# Patient Record
Sex: Male | Born: 1970 | Race: White | Hispanic: No | Marital: Single | Smoking: Former smoker
Health system: Southern US, Community
[De-identification: ages and names within clinical notes are randomized; demographics above are authoritative.]

## PROBLEM LIST (undated history)

## (undated) DIAGNOSIS — Z87442 Personal history of urinary calculi: Secondary | ICD-10-CM

## (undated) DIAGNOSIS — E785 Hyperlipidemia, unspecified: Secondary | ICD-10-CM

## (undated) DIAGNOSIS — M545 Low back pain, unspecified: Secondary | ICD-10-CM

## (undated) DIAGNOSIS — J45909 Unspecified asthma, uncomplicated: Secondary | ICD-10-CM

## (undated) DIAGNOSIS — R030 Elevated blood-pressure reading, without diagnosis of hypertension: Secondary | ICD-10-CM

## (undated) HISTORY — DX: Hyperlipidemia, unspecified: E78.5

## (undated) HISTORY — PX: TONSILLECTOMY: SUR1361

## (undated) HISTORY — DX: Unspecified asthma, uncomplicated: J45.909

## (undated) HISTORY — PX: KNEE SURGERY: SHX244

## (undated) HISTORY — DX: Elevated blood-pressure reading, without diagnosis of hypertension: R03.0

## (undated) HISTORY — PX: APPENDECTOMY: SHX54

## (undated) HISTORY — DX: Personal history of urinary calculi: Z87.442

---

## 2009-04-18 ENCOUNTER — Ambulatory Visit: Payer: Self-pay | Admitting: Family Medicine

## 2009-04-18 DIAGNOSIS — J45909 Unspecified asthma, uncomplicated: Secondary | ICD-10-CM

## 2009-04-18 DIAGNOSIS — Z87442 Personal history of urinary calculi: Secondary | ICD-10-CM | POA: Insufficient documentation

## 2009-04-18 DIAGNOSIS — J4520 Mild intermittent asthma, uncomplicated: Secondary | ICD-10-CM | POA: Insufficient documentation

## 2009-04-18 HISTORY — DX: Personal history of urinary calculi: Z87.442

## 2009-04-18 HISTORY — DX: Unspecified asthma, uncomplicated: J45.909

## 2009-04-20 ENCOUNTER — Ambulatory Visit: Payer: Self-pay | Admitting: Family Medicine

## 2009-04-20 LAB — CONVERTED CEMR LAB
Albumin: 4.1 g/dL (ref 3.5–5.2)
Basophils Absolute: 0 10*3/uL (ref 0.0–0.1)
Basophils Relative: 0.3 % (ref 0.0–3.0)
Bilirubin Urine: NEGATIVE
Blood in Urine, dipstick: NEGATIVE
CO2: 31 meq/L (ref 19–32)
Calcium: 9.5 mg/dL (ref 8.4–10.5)
Chloride: 105 meq/L (ref 96–112)
Creatinine, Ser: 0.8 mg/dL (ref 0.4–1.5)
Eosinophils Absolute: 0.2 10*3/uL (ref 0.0–0.7)
Glucose, Bld: 95 mg/dL (ref 70–99)
Glucose, Urine, Semiquant: NEGATIVE
HDL: 37 mg/dL — ABNORMAL LOW (ref >39.00)
Hemoglobin: 14.2 g/dL (ref 13.0–17.0)
Ketones, urine, test strip: NEGATIVE
MCHC: 33.5 g/dL (ref 30.0–36.0)
MCV: 98.4 fL (ref 78.0–100.0)
Monocytes Absolute: 0.6 10*3/uL (ref 0.1–1.0)
Neutro Abs: 4.3 10*3/uL (ref 1.4–7.7)
Neutrophils Relative %: 56.8 % (ref 43.0–77.0)
Nitrite: NEGATIVE
RBC: 4.29 M/uL (ref 4.22–5.81)
RDW: 12.3 % (ref 11.5–14.6)
Specific Gravity, Urine: 1.02
TSH: 1.11 microintl units/mL (ref 0.35–5.50)
Total CHOL/HDL Ratio: 5
Total Protein: 7.1 g/dL (ref 6.0–8.3)
Triglycerides: 98 mg/dL (ref 0.0–149.0)
pH: 7

## 2009-04-30 ENCOUNTER — Ambulatory Visit: Payer: Self-pay | Admitting: Family Medicine

## 2010-05-30 ENCOUNTER — Ambulatory Visit: Payer: Self-pay | Admitting: Family Medicine

## 2010-05-30 LAB — CONVERTED CEMR LAB
Albumin: 3.6 g/dL (ref 3.5–5.2)
BUN: 13 mg/dL (ref 6–23)
Basophils Relative: 0.6 % (ref 0.0–3.0)
Bilirubin Urine: NEGATIVE
Bilirubin, Direct: 0 mg/dL (ref 0.0–0.3)
CO2: 26 meq/L (ref 19–32)
Chloride: 104 meq/L (ref 96–112)
Cholesterol: 211 mg/dL — ABNORMAL HIGH (ref 0–200)
Creatinine, Ser: 0.8 mg/dL (ref 0.4–1.5)
Direct LDL: 168.5 mg/dL
Eosinophils Absolute: 0.1 10*3/uL (ref 0.0–0.7)
Glucose, Bld: 90 mg/dL (ref 70–99)
Glucose, Urine, Semiquant: NEGATIVE
Ketones, urine, test strip: NEGATIVE
MCHC: 33.9 g/dL (ref 30.0–36.0)
MCV: 96.6 fL (ref 78.0–100.0)
Monocytes Absolute: 0.4 10*3/uL (ref 0.1–1.0)
Neutrophils Relative %: 43.8 % (ref 43.0–77.0)
RBC: 4.1 M/uL — ABNORMAL LOW (ref 4.22–5.81)
RDW: 13.4 % (ref 11.5–14.6)
Specific Gravity, Urine: 1.02
Total CHOL/HDL Ratio: 6
Total Protein: 6.6 g/dL (ref 6.0–8.3)
pH: 6

## 2010-06-06 ENCOUNTER — Ambulatory Visit
Admission: RE | Admit: 2010-06-06 | Discharge: 2010-06-06 | Payer: Self-pay | Source: Home / Self Care | Attending: Family Medicine | Admitting: Family Medicine

## 2010-06-06 DIAGNOSIS — E785 Hyperlipidemia, unspecified: Secondary | ICD-10-CM

## 2010-06-06 HISTORY — DX: Hyperlipidemia, unspecified: E78.5

## 2010-07-04 NOTE — Assessment & Plan Note (Signed)
Summary: CPX // RS   Vital Signs:  Patient profile:   40 year old male Height:      71 inches Weight:      240 pounds BMI:     33.59 Temp:     97.8 degrees F oral Pulse rate:   72 / minute Pulse rhythm:   regular Resp:     12 per minute BP sitting:   130 / 84  (left arm) Cuff size:   large  Vitals Entered By: Sid Falcon LPN (June 06, 2010 8:54 AM)  Nutrition Counseling: Patient's BMI is greater than 25 and therefore counseled on weight management options.  History of Present Illness: Here for CPE.  Still smoking. Would like to consider Chantix.  has used patch in past. No hx of depression.   Hx asthma which has been mild and intermittent in past.  Exercising regularly.  Clinical Review Panels:  Immunizations   Last Tetanus Booster:  Historical (06/02/2005)   Last Flu Vaccine:  Fluvax 3+ (06/06/2010)   Allergies (verified): No Known Drug Allergies  Past History:  Past Medical History: Last updated: 04/18/2009 Asthma Chicken pox Kidney stones  Past Surgical History: Last updated: 04/18/2009 Tonsillectomy Left knee surgery (arthroscopic)  Family History: Last updated: 04/18/2009 Family History of Arthritis Family History Hypertension Family History Lung cancer  Grandparent Family History of Stroke   Grandparent Heart disease  Father 77 MI  Social History: Last updated: 04/18/2009 Occupation:  realtor Single Current Smoker Alcohol use-yes Regular exercise-yes  Risk Factors: Exercise: yes (04/18/2009)  Risk Factors: Smoking Status: current (04/18/2009) PMH-FH-SH reviewed for relevance  Review of Systems  The patient denies anorexia, fever, weight loss, weight gain, vision loss, decreased hearing, hoarseness, chest pain, syncope, dyspnea on exertion, peripheral edema, prolonged cough, headaches, hemoptysis, abdominal pain, melena, hematochezia, severe indigestion/heartburn, hematuria, incontinence, genital sores, muscle weakness, suspicious  skin lesions, transient blindness, difficulty walking, depression, unusual weight change, abnormal bleeding, enlarged lymph nodes, and testicular masses.    Physical Exam  General:  Well-developed,well-nourished,in no acute distress; alert,appropriate and cooperative throughout examination Head:  Normocephalic and atraumatic without obvious abnormalities. No apparent alopecia or balding. Eyes:  No corneal or conjunctival inflammation noted. EOMI. Perrla. Funduscopic exam benign, without hemorrhages, exudates or papilledema. Vision grossly normal. Ears:  External ear exam shows no significant lesions or deformities.  Otoscopic examination reveals clear canals, tympanic membranes are intact bilaterally without bulging, retraction, inflammation or discharge. Hearing is grossly normal bilaterally. Mouth:  Oral mucosa and oropharynx without lesions or exudates.  Teeth in good repair. Neck:  No deformities, masses, or tenderness noted. Lungs:  Normal respiratory effort, chest expands symmetrically. Lungs are clear to auscultation, no crackles or wheezes. Heart:  Normal rate and regular rhythm. S1 and S2 normal without gallop, murmur, click, rub or other extra sounds. Abdomen:  Bowel sounds positive,abdomen soft and non-tender without masses, organomegaly or hernias noted. Genitalia:  Testes bilaterally descended without nodularity, tenderness or masses. No scrotal masses or lesions. No penis lesions or urethral discharge. Msk:  No deformity or scoliosis noted of thoracic or lumbar spine.   Extremities:  No clubbing, cyanosis, edema, or deformity noted with normal full range of motion of all joints.   Neurologic:  alert & oriented X3 and cranial nerves II-XII intact.   Skin:  no rashes and no suspicious lesions.   Cervical Nodes:  No lymphadenopathy noted Psych:  Cognition and judgment appear intact. Alert and cooperative with normal attention span and concentration. No apparent delusions,  illusions,  hallucinations   Impression & Recommendations:  Problem # 1:  ROUTINE GENERAL MEDICAL EXAM@HEALTH  CARE FACL (ICD-V70.0) needs to lose some more weight and quit smoking. Good exercise level.  Flu vaccine advised and pt consents.  Problem # 2:  ASTHMA (ICD-493.90)  His updated medication list for this problem includes:    Proventil Hfa 108 (90 Base) Mcg/act Aers (Albuterol sulfate) .[name] Kitchen... 2 puffs every 4 hours as needed  Problem # 3:  HYPERLIPIDEMIA (ICD-272.4)  Complete Medication List: 1)  Multi Vitamin Mens Tabs (Multiple vitamin) .... Once daily 2)  Proventil Hfa 108 (90 Base) Mcg/act Aers (Albuterol sulfate) .... 2 puffs every 4 hours as needed 3)  Chantix Starting Month Pak 0.5 Mg X 11 & 1 Mg X 42 Tabs (Varenicline tartrate) .... Use as directed 4)  Chantix Continuing Month Pak 1 Mg Tabs (Varenicline tartrate) .... Use as directed  Other Orders: Flu Vaccine 73yrs + (16109) Admin 1st Vaccine (60454)  Patient Instructions: 1)  It is important that you exercise reguarly at least 20 minutes 5 times a week. If you develop chest pain, have severe difficulty breathing, or feel very tired, stop exercising immediately and seek medical attention.  2)  You need to lose weight. Consider a lower calorie diet and regular exercise.  3)  Schedule fasting lipids  in 4 months Prescriptions: CHANTIX CONTINUING MONTH PAK 1 MG TABS (VARENICLINE TARTRATE) use as directed  #1 x 1   Entered and Authorized by:   Evelena Peat MD   Signed by:   Evelena Peat MD on 06/06/2010   Method used:   Print then Give to Patient   RxID:   [id number] CHANTIX STARTING MONTH PAK 0.5 MG X 11 & 1 MG X 42 TABS (VARENICLINE TARTRATE) use as directed  #1 x 0   Entered and Authorized by:   Evelena Peat MD   Signed by:   Evelena Peat MD on 06/06/2010   Method used:   Print then Give to Patient   RxID:   [id number] PROVENTIL HFA 108 (90 BASE) MCG/ACT AERS (ALBUTEROL SULFATE) 2 puffs every 4 hours as  needed  #1 x 2   Entered and Authorized by:   Evelena Peat MD   Signed by:   Evelena Peat MD on 06/06/2010   Method used:   Print then Give to Patient   RxID:   [id number]    Orders Added: 1)  Flu Vaccine 50yrs + [44034] 2)  Admin 1st Vaccine [90471] 3)  Est. Patient 18-39 years [74259]   Immunizations Administered:  Influenza Vaccine # 1:    Vaccine Type: Fluvax 3+    Site: left deltoid    Mfr: Sanofi Pasteur    Dose: 0.5 ml    Route: IM    Given by: Sid Falcon LPN    Exp. Date: 11/01/2010    Lot #: [id number]  Flu Vaccine Consent Questions:    Do you have a history of severe allergic reactions to this vaccine? no    Any prior history of allergic reactions to egg and/or gelatin? no    Do you have a sensitivity to the preservative Thimersol? no    Do you have a past history of Guillan-Barre Syndrome? no    Do you currently have an acute febrile illness? no    Have you ever had a severe reaction to latex? no    Vaccine information given and explained to patient? yes   Immunizations Administered:  Influenza Vaccine # 1:  Vaccine Type: Fluvax 3+    Site: left deltoid    Mfr: Sanofi Pasteur    Dose: 0.5 ml    Route: IM    Given by: Sid Falcon LPN    Exp. Date: 11/01/2010    Lot #: [id number]

## 2010-07-23 ENCOUNTER — Other Ambulatory Visit (HOSPITAL_COMMUNITY): Payer: Self-pay | Admitting: Orthopedic Surgery

## 2010-07-23 DIAGNOSIS — M79671 Pain in right foot: Secondary | ICD-10-CM

## 2010-08-05 ENCOUNTER — Ambulatory Visit (HOSPITAL_COMMUNITY)
Admission: RE | Admit: 2010-08-05 | Discharge: 2010-08-05 | Disposition: A | Discharge status: Home / Self Care | Payer: BC Managed Care – PPO | Source: Ambulatory Visit | Attending: Orthopedic Surgery | Admitting: Orthopedic Surgery

## 2010-08-05 ENCOUNTER — Other Ambulatory Visit (HOSPITAL_COMMUNITY): Payer: Self-pay

## 2010-08-05 DIAGNOSIS — M79609 Pain in unspecified limb: Secondary | ICD-10-CM | POA: Insufficient documentation

## 2010-08-05 DIAGNOSIS — M79671 Pain in right foot: Secondary | ICD-10-CM

## 2010-08-05 MED ORDER — TECHNETIUM TC 99M MEDRONATE IV KIT
25.0000 | PACK | Freq: Once | INTRAVENOUS | Status: AC | PRN
  Administered 2010-08-05: 25 via INTRAVENOUS

## 2010-10-04 ENCOUNTER — Other Ambulatory Visit (INDEPENDENT_AMBULATORY_CARE_PROVIDER_SITE_OTHER): Payer: BC Managed Care – PPO | Admitting: Family Medicine

## 2010-10-04 DIAGNOSIS — E785 Hyperlipidemia, unspecified: Secondary | ICD-10-CM

## 2010-10-04 LAB — LIPID PANEL
Cholesterol: 195 mg/dL (ref 0–200)
VLDL: 34 mg/dL (ref 0.0–40.0)

## 2010-10-07 NOTE — Progress Notes (Signed)
Quick Note:  Pt informed on personally identified VM ______ 

## 2011-02-19 ENCOUNTER — Other Ambulatory Visit: Payer: Self-pay | Admitting: Family Medicine

## 2011-02-21 ENCOUNTER — Other Ambulatory Visit: Payer: Self-pay | Admitting: Family Medicine

## 2011-06-27 ENCOUNTER — Emergency Department (HOSPITAL_BASED_OUTPATIENT_CLINIC_OR_DEPARTMENT_OTHER)
Admission: EM | Admit: 2011-06-27 | Discharge: 2011-06-27 | Disposition: A | Discharge status: Home / Self Care | Payer: BC Managed Care – PPO | Attending: Emergency Medicine | Admitting: Emergency Medicine

## 2011-06-27 ENCOUNTER — Encounter (HOSPITAL_BASED_OUTPATIENT_CLINIC_OR_DEPARTMENT_OTHER): Payer: Self-pay

## 2011-06-27 DIAGNOSIS — S339XXA Sprain of unspecified parts of lumbar spine and pelvis, initial encounter: Secondary | ICD-10-CM | POA: Insufficient documentation

## 2011-06-27 DIAGNOSIS — F17200 Nicotine dependence, unspecified, uncomplicated: Secondary | ICD-10-CM | POA: Insufficient documentation

## 2011-06-27 DIAGNOSIS — X500XXA Overexertion from strenuous movement or load, initial encounter: Secondary | ICD-10-CM | POA: Insufficient documentation

## 2011-06-27 DIAGNOSIS — S39012A Strain of muscle, fascia and tendon of lower back, initial encounter: Secondary | ICD-10-CM

## 2011-06-27 MED ORDER — KETOROLAC TROMETHAMINE 60 MG/2ML IM SOLN
60.0000 mg | Freq: Once | INTRAMUSCULAR | Status: AC
  Administered 2011-06-27: 60 mg via INTRAMUSCULAR
  Filled 2011-06-27: qty 2

## 2011-06-27 MED ORDER — DICLOFENAC SODIUM 75 MG PO TBEC: 75.0000 mg | DELAYED_RELEASE_TABLET | Freq: Two times a day (BID) | ORAL | Status: DC

## 2011-06-27 MED ORDER — METHOCARBAMOL 500 MG PO TABS
500.0000 mg | ORAL_TABLET | Freq: Once | ORAL | Status: AC
  Administered 2011-06-27: 500 mg via ORAL
  Filled 2011-06-27: qty 1

## 2011-06-27 MED ORDER — DEXAMETHASONE SODIUM PHOSPHATE 10 MG/ML IJ SOLN
10.0000 mg | Freq: Once | INTRAMUSCULAR | Status: AC
  Administered 2011-06-27: 10 mg via INTRAMUSCULAR

## 2011-06-27 MED ORDER — DEXAMETHASONE SODIUM PHOSPHATE 4 MG/ML IJ SOLN
INTRAMUSCULAR | Status: AC
  Filled 2011-06-27: qty 3

## 2011-06-27 MED ORDER — DOXYCYCLINE HYCLATE 100 MG PO CAPS: 100.0000 mg | ORAL_CAPSULE | Freq: Two times a day (BID) | ORAL | Status: DC

## 2011-06-27 MED ORDER — OXYCODONE-ACETAMINOPHEN 5-325 MG PO TABS: 1.0000 | ORAL_TABLET | ORAL | Status: AC | PRN

## 2011-06-27 MED ORDER — METHOCARBAMOL 500 MG PO TABS: 500.0000 mg | ORAL_TABLET | Freq: Two times a day (BID) | ORAL | Status: AC

## 2011-06-27 NOTE — ED Provider Notes (Signed)
Medical screening examination/treatment/procedure(s) were performed by non-physician practitioner and as supervising physician I was immediately available for consultation/collaboration.   Dayton Bailiff, MD 06/27/11 1759

## 2011-06-27 NOTE — ED Notes (Signed)
C/o back spasms started 2 days ago after working out-worse today-states pain is lower back radiating to both legs and testicles

## 2011-06-27 NOTE — ED Provider Notes (Signed)
History     CSN: [id number]  Arrival date & time 06/27/11  1704   First MD Initiated Contact with Patient 06/27/11 1718      5:45 PM HPI Patient reports was bending over to pick up a lightweight object. States he acutely developed a pain in his lumbar spine. Reports pain radiates around bilateral lower back, buttocks, thighs, testicles, down to knees. Reports pain is worse with certain positions. Denies improvement in pain with Vicodin, rest, and icy hot patches denies numbness, tingling, weakness, saddle anesthesias, perineal numbness, incontinence. Patient is a 41 y.o. male presenting with back pain. The history is provided by the patient.  Back Pain  This is a new problem. The current episode started 2 days ago. The problem occurs constantly. The problem has been gradually worsening. Associated with: Bending over. The pain is present in the lumbar spine. The quality of the pain is described as shooting and aching. The pain radiates to the left thigh and right thigh. The pain is severe. The symptoms are aggravated by bending and certain positions. Pertinent negatives include no chest pain, no fever, no numbness, no headaches, no abdominal pain, no bowel incontinence, no perianal numbness, no bladder incontinence, no dysuria, no pelvic pain, no leg pain, no paresthesias, no paresis, no tingling and no weakness. He has tried NSAIDs, analgesics and bed rest for the symptoms. The treatment provided no relief.    History reviewed. No pertinent past medical history.  Past Surgical History  Procedure Date  . Knee surgery   . Tonsillectomy     No family history on file.  History  Substance Use Topics  . Smoking status: Current Everyday Smoker  . Smokeless tobacco: Not on file  . Alcohol Use: Yes      Review of Systems  Constitutional: Negative for fever and chills.  HENT: Negative for neck pain.   Respiratory: Negative for cough and shortness of breath.   Cardiovascular: Negative for  chest pain and palpitations.  Gastrointestinal: Negative for nausea, vomiting, abdominal pain and bowel incontinence.  Genitourinary: Positive for testicular pain. Negative for bladder incontinence, dysuria, hematuria, flank pain, penile swelling, scrotal swelling, penile pain and pelvic pain.  Musculoskeletal: Positive for back pain. Negative for myalgias and gait problem.       Denies saddle anesthesias, perineal numbness, bowel incontinence, urinary incontinence  Neurological: Negative for dizziness, tingling, weakness, numbness, headaches and paresthesias.  All other systems reviewed and are negative.    Allergies  Review of patient's allergies indicates no known allergies.  Home Medications   Current Outpatient Rx  Name Route Sig Dispense Refill  . ALBUTEROL SULFATE HFA 108 (90 BASE) MCG/ACT IN AERS      . ASPIRIN EC 81 MG PO TBEC Oral Take 81 mg by mouth 2 (two) times daily.    . OMEGA-3 FATTY ACIDS 1000 MG PO CAPS Oral Take 1-2 g by mouth 2 (two) times daily.    [name] Kitchen HYDROCODONE-ACETAMINOPHEN PO Oral Take 2 tablets by mouth once.    [name] Kitchen MAGNESIUM 250 MG PO TABS Oral Take 1 tablet by mouth daily.    . ADULT MULTIVITAMIN W/MINERALS CH Oral Take 1 tablet by mouth daily.    [name] Kitchen PLANT STEROL STANOL-PANTETHINE 450-75 MG PO TABS Oral Take 1 tablet by mouth 2 (two) times daily.    [name] Kitchen POTASSIUM 99 MG PO TABS Oral Take 1 tablet by mouth daily.      BP 126/68  Pulse 86  Temp(Src) 99.4 F (37.4 C) (Oral)  Resp 16  Ht 5\' 11"  (1.803 m)  Wt 242 lb (109.77 kg)  BMI 33.75 kg/m2  SpO2 100%  Physical Exam  Constitutional: He is oriented to person, place, and time. He appears well-developed and well-nourished.  HENT:  Head: Normocephalic and atraumatic.  Eyes: Conjunctivae are normal. Pupils are equal, round, and reactive to light.  Neck: Normal range of motion. Neck supple.  Cardiovascular: Normal rate, regular rhythm and normal heart sounds.   Pulmonary/Chest: Effort normal and breath sounds  normal.  Abdominal: Soft. Bowel sounds are normal. Hernia confirmed negative in the right inguinal area and confirmed negative in the left inguinal area.  Genitourinary: Testes normal and penis normal. Right testis shows no mass, no swelling and no tenderness. Cremasteric reflex is not absent on the right side. Left testis shows no mass, no swelling and no tenderness. Cremasteric reflex is not absent on the left side.       Negative for a bell clapper deformity  Musculoskeletal:       Lumbar back: He exhibits decreased range of motion (due to pain. ). He exhibits no tenderness, no bony tenderness, no swelling, no edema, no deformity, no laceration, no pain, no spasm and normal pulse.  Lymphadenopathy:       Right: No inguinal adenopathy present.       Left: No inguinal adenopathy present.  Neurological: He is alert and oriented to person, place, and time.  Skin: Skin is warm and dry. No rash noted. No erythema. No pallor.  Psychiatric: He has a normal mood and affect. His behavior is normal.    ED Course  Procedures   MDM  Normal GU evaluation. Pain in back is not reproducible. Suspect muscle spasms however did advise patient to return immediately for worsening symptoms such as saddle anesthesias, perineum numbness, incontinence, testicular deformity, or worsening testicular pain. He agrees with plan and is ready for discharge.       Thomasene Lot, PA-C 06/27/11 1749

## 2011-08-08 ENCOUNTER — Other Ambulatory Visit (INDEPENDENT_AMBULATORY_CARE_PROVIDER_SITE_OTHER): Payer: BC Managed Care – PPO

## 2011-08-08 DIAGNOSIS — Z0000 Encounter for general adult medical examination without abnormal findings: Secondary | ICD-10-CM

## 2011-08-08 LAB — CBC WITH DIFFERENTIAL/PLATELET
Basophils Relative: 0.3 % (ref 0.0–3.0)
Eosinophils Absolute: 0.2 10*3/uL (ref 0.0–0.7)
Eosinophils Relative: 2 % (ref 0.0–5.0)
HCT: 40.1 % (ref 39.0–52.0)
Lymphs Abs: 2.7 10*3/uL (ref 0.7–4.0)
MCHC: 33.3 g/dL (ref 30.0–36.0)
MCV: 96.3 fl (ref 78.0–100.0)
Monocytes Absolute: 0.4 10*3/uL (ref 0.1–1.0)
Neutro Abs: 5.1 10*3/uL (ref 1.4–7.7)
Neutrophils Relative %: 60.7 % (ref 43.0–77.0)
RBC: 4.16 Mil/uL — ABNORMAL LOW (ref 4.22–5.81)
WBC: 8.4 10*3/uL (ref 4.5–10.5)

## 2011-08-08 LAB — LIPID PANEL
Cholesterol: 170 mg/dL (ref 0–200)
LDL Cholesterol: 104 mg/dL — ABNORMAL HIGH (ref 0–99)
Triglycerides: 150 mg/dL — ABNORMAL HIGH (ref 0.0–149.0)

## 2011-08-08 LAB — POCT URINALYSIS DIPSTICK
Bilirubin, UA: NEGATIVE
Blood, UA: NEGATIVE
Glucose, UA: NEGATIVE
Ketones, UA: NEGATIVE
Spec Grav, UA: 1.02
Urobilinogen, UA: 0.2

## 2011-08-08 LAB — TSH: TSH: 0.78 u[IU]/mL (ref 0.35–5.50)

## 2011-08-08 LAB — HEPATIC FUNCTION PANEL
ALT: 16 U/L (ref 0–53)
Albumin: 3.9 g/dL (ref 3.5–5.2)
Bilirubin, Direct: 0 mg/dL (ref 0.0–0.3)
Total Protein: 6.7 g/dL (ref 6.0–8.3)

## 2011-08-08 LAB — BASIC METABOLIC PANEL
CO2: 27 mEq/L (ref 19–32)
Chloride: 104 mEq/L (ref 96–112)
Creatinine, Ser: 0.9 mg/dL (ref 0.4–1.5)
Potassium: 4.6 mEq/L (ref 3.5–5.1)

## 2011-08-14 ENCOUNTER — Ambulatory Visit (INDEPENDENT_AMBULATORY_CARE_PROVIDER_SITE_OTHER): Payer: BC Managed Care – PPO | Admitting: Family Medicine

## 2011-08-14 ENCOUNTER — Encounter: Payer: Self-pay | Admitting: Family Medicine

## 2011-08-14 VITALS — BP 130/80 | HR 80 | Temp 97.8°F | Resp 12 | Ht 71.75 in | Wt 236.0 lb

## 2011-08-14 DIAGNOSIS — Z87891 Personal history of nicotine dependence: Secondary | ICD-10-CM

## 2011-08-14 DIAGNOSIS — Z0000 Encounter for general adult medical examination without abnormal findings: Secondary | ICD-10-CM

## 2011-08-14 MED ORDER — BUPROPION HCL ER (SMOKING DET) 150 MG PO TB12: 150.0000 mg | ORAL_TABLET | Freq: Two times a day (BID) | ORAL | Status: DC

## 2011-08-14 NOTE — Patient Instructions (Signed)
Smoking Cessation This document explains the best ways for you to quit smoking and new treatments to help. It lists new medicines that can double or triple your chances of quitting and quitting for good. It also considers ways to avoid relapses and concerns you may have about quitting, including weight gain. NICOTINE: A POWERFUL ADDICTION If you have tried to quit smoking, you know how hard it can be. It is hard because nicotine is a very addictive drug. For some people, it can be as addictive as heroin or cocaine. Usually, people make 2 or 3 tries, or more, before finally being able to quit. Each time you try to quit, you can learn about what helps and what hurts. Quitting takes hard work and a lot of effort, but you can quit smoking. QUITTING SMOKING IS ONE OF THE MOST IMPORTANT THINGS YOU WILL EVER DO.  You will live longer, feel better, and live better.   The impact on your body of quitting smoking is felt almost immediately:   Within 20 minutes, blood pressure decreases. Pulse returns to its normal level.   After 8 hours, carbon monoxide levels in the blood return to normal. Oxygen level increases.   After 24 hours, chance of heart attack starts to decrease. Breath, hair, and body stop smelling like smoke.   After 48 hours, damaged nerve endings begin to recover. Sense of taste and smell improve.   After 72 hours, the body is virtually free of nicotine. Bronchial tubes relax and breathing becomes easier.   After 2 to 12 weeks, lungs can hold more air. Exercise becomes easier and circulation improves.   Quitting will reduce your risk of having a heart attack, stroke, cancer, or lung disease:   After 1 year, the risk of coronary heart disease is cut in half.   After 5 years, the risk of stroke falls to the same as a nonsmoker.   After 10 years, the risk of lung cancer is cut in half and the risk of other cancers decreases significantly.   After 15 years, the risk of coronary heart  disease drops, usually to the level of a nonsmoker.   If you are pregnant, quitting smoking will improve your chances of having a healthy baby.   The people you live with, especially your children, will be healthier.   You will have extra money to spend on things other than cigarettes.  FIVE KEYS TO QUITTING Studies have shown that these 5 steps will help you quit smoking and quit for good. You have the best chances of quitting if you use them together: 1. Get ready.  2. Get support and encouragement.  3. Learn new skills and behaviors.  4. Get medicine to reduce your nicotine addiction and use it correctly.  5. Be prepared for relapse or difficult situations. Be determined to continue trying to quit, even if you do not succeed at first.  1. GET READY  Set a quit date.   Change your environment.   Get rid of ALL cigarettes, ashtrays, matches, and lighters in your home, car, and place of work.   Do not let people smoke in your home.   Review your past attempts to quit. Think about what worked and what did not.   Once you quit, do not smoke. NOT EVEN A PUFF!  2. GET SUPPORT AND ENCOURAGEMENT Studies have shown that you have a better chance of being successful if you have help. You can get support in many ways.  Tell   your family, friends, and coworkers that you are going to quit and need their support. Ask them not to smoke around you.   Talk to your caregivers (doctor, dentist, nurse, pharmacist, psychologist, and/or smoking counselor).   Get individual, group, or telephone counseling and support. The more counseling you have, the better your chances are of quitting. Programs are available at local hospitals and health centers. Call your local health department for information about programs in your area.   Spiritual beliefs and practices may help some smokers quit.   Quit meters are small computer programs online or downloadable that keep track of quit statistics, such as amount  of "quit-time," cigarettes not smoked, and money saved.   Many smokers find one or more of the many self-help books available useful in helping them quit and stay off tobacco.  3. LEARN NEW SKILLS AND BEHAVIORS  Try to distract yourself from urges to smoke. Talk to someone, go for a walk, or occupy your time with a task.   When you first try to quit, change your routine. Take a different route to work. Drink tea instead of coffee. Eat breakfast in a different place.   Do something to reduce your stress. Take a hot bath, exercise, or read a book.   Plan something enjoyable to do every day. Reward yourself for not smoking.   Explore interactive web-based programs that specialize in helping you quit.  4. GET MEDICINE AND USE IT CORRECTLY Medicines can help you stop smoking and decrease the urge to smoke. Combining medicine with the above behavioral methods and support can quadruple your chances of successfully quitting smoking. The [place] Food and Drug Administration (FDA) has approved 7 medicines to help you quit smoking. These medicines fall into 3 categories.  Nicotine replacement therapy (delivers nicotine to your body without the negative effects and risks of smoking):   Nicotine gum: Available over-the-counter.   Nicotine lozenges: Available over-the-counter.   Nicotine inhaler: Available by prescription.   Nicotine nasal spray: Available by prescription.   Nicotine skin patches (transdermal): Available by prescription and over-the-counter.   Antidepressant medicine (helps people abstain from smoking, but how this works is unknown):   Bupropion sustained-release (SR) tablets: Available by prescription.   Nicotinic receptor partial agonist (simulates the effect of nicotine in your brain):   Varenicline tartrate tablets: Available by prescription.   Ask your caregiver for advice about which medicines to use and how to use them. Carefully read the information on the package.    Everyone who is trying to quit may benefit from using a medicine. If you are pregnant or trying to become pregnant, nursing an infant, you are under age 18, or you smoke fewer than 10 cigarettes per day, talk to your caregiver before taking any nicotine replacement medicines.   You should stop using a nicotine replacement product and call your caregiver if you experience nausea, dizziness, weakness, vomiting, fast or irregular heartbeat, mouth problems with the lozenge or gum, or redness or swelling of the skin around the patch that does not go away.   Do not use any other product containing nicotine while using a nicotine replacement product.   Talk to your caregiver before using these products if you have diabetes, heart disease, asthma, stomach ulcers, you had a recent heart attack, you have high blood pressure that is not controlled with medicine, a history of irregular heartbeat, or you have been prescribed medicine to help you quit smoking.  5. BE PREPARED FOR RELAPSE OR   DIFFICULT SITUATIONS  Most relapses occur within the first 3 months after quitting. Do not be discouraged if you start smoking again. Remember, most people try several times before they finally quit.   You may have symptoms of withdrawal because your body is used to nicotine. You may crave cigarettes, be irritable, feel very hungry, cough often, get headaches, or have difficulty concentrating.   The withdrawal symptoms are only temporary. They are strongest when you first quit, but they will go away within 10 to 14 days.  Here are some difficult situations to watch for:  Alcohol. Avoid drinking alcohol. Drinking lowers your chances of successfully quitting.   Caffeine. Try to reduce the amount of caffeine you consume. It also lowers your chances of successfully quitting.   Other smokers. Being around smoking can make you want to smoke. Avoid smokers.   Weight gain. Many smokers will gain weight when they quit, usually  less than 10 pounds. Eat a healthy diet and stay active. Do not let weight gain distract you from your main goal, quitting smoking. Some medicines that help you quit smoking may also help delay weight gain. You can always lose the weight gained after you quit.   Bad mood or depression. There are a lot of ways to improve your mood other than smoking.  If you are having problems with any of these situations, talk to your caregiver. SPECIAL SITUATIONS AND CONDITIONS Studies suggest that everyone can quit smoking. Your situation or condition can give you a special reason to quit.  Pregnant women/new mothers: By quitting, you protect your baby's health and your own.   Hospitalized patients: By quitting, you reduce health problems and help healing.   Heart attack patients: By quitting, you reduce your risk of a second heart attack.   Lung, head, and neck cancer patients: By quitting, you reduce your chance of a second cancer.   Parents of children and adolescents: By quitting, you protect your children from illnesses caused by secondhand smoke.  QUESTIONS TO THINK ABOUT Think about the following questions before you try to stop smoking. You may want to talk about your answers with your caregiver.  Why do you want to quit?   If you tried to quit in the past, what helped and what did not?   What will be the most difficult situations for you after you quit? How will you plan to handle them?   Who can help you through the tough times? Your family? Friends? Caregiver?   What pleasures do you get from smoking? What ways can you still get pleasure if you quit?  Here are some questions to ask your caregiver:  How can you help me to be successful at quitting?   What medicine do you think would be best for me and how should I take it?   What should I do if I need more help?   What is smoking withdrawal like? How can I get information on withdrawal?  Quitting takes hard work and a lot of effort,  but you can quit smoking. FOR MORE INFORMATION  Smokefree.gov (http://www.davis-sullivan.com/) provides free, accurate, evidence-based information and professional assistance to help support the immediate and long-term needs of people trying to quit smoking. Document Released: 05/13/2001 Document Revised: 05/08/2011 Document Reviewed: 03/05/2009 [facility] Patient Information 2012 [place], [place].  Zyban start one daily for 3 days then increase to one twice daily Start medication 2 weeks prior to start date.

## 2011-08-14 NOTE — Progress Notes (Signed)
Subjective:    Patient ID: Brett Morgan, male    DOB: 06-04-1970, 41 y.o.   MRN: [id number]  HPI  This is a complete physical exam. He has history of asthma which has been mild intermittent. Still smoking 1 pack cigarettes per day but wants to quit. Previously prescribed Chantix but never started because of concern about possible anxiety side effects. He is specifically interested in trying Zyban. Has also tried nicotine patches in the past.  He's had some problems with recent lumbar back pain. He is seeing orthopedist. MRI scheduled for tomorrow. He has some bilateral radiculopathy symptoms mostly right lower extremity.  History of dyslipidemia. Triglycerides and low HDL. Father had coronary disease in his 88s. Patient is taking omega-3 supplement and plant sterol as well as baby aspirin. Still runs some for exercise.  Past Medical History  Diagnosis Date  . ASTHMA 04/18/2009    Qualifier: Diagnosis of  By: Gabriel Rung LPN, Harriett Sine    . Personal history of urinary calculi 04/18/2009    Qualifier: Diagnosis of  By: Gabriel Rung LPN, Harriett Sine    . HYPERLIPIDEMIA 06/06/2010    Qualifier: Diagnosis of  By: Caryl Never MD, Avey Mcmanamon     Past Surgical History  Procedure Date  . Knee surgery   . Tonsillectomy   . Tonsillectomy   . Knee surgery     arthroscopic left    reports that he has been smoking.  He does not have any smokeless tobacco history on file. He reports that he drinks alcohol. He reports that he does not use illicit drugs. family history includes Arthritis in his other; Cancer in his other; Heart disease (age of onset:58) in his father; Hypertension in his other; and Stroke in his other. No Known Allergies    Review of Systems  Constitutional: Negative for fever, activity change, appetite change, fatigue and unexpected weight change.  HENT: Negative for ear pain, congestion and trouble swallowing.   Eyes: Negative for pain and visual disturbance.  Respiratory: Negative for cough,  shortness of breath and wheezing.   Cardiovascular: Negative for chest pain and palpitations.  Gastrointestinal: Negative for nausea, vomiting, abdominal pain, diarrhea, constipation, blood in stool, abdominal distention and rectal pain.  Genitourinary: Negative for dysuria, hematuria and testicular pain.  Musculoskeletal: Positive for back pain. Negative for joint swelling and arthralgias.  Skin: Negative for rash.  Neurological: Positive for numbness. Negative for dizziness, syncope and headaches.  Hematological: Negative for adenopathy.  Psychiatric/Behavioral: Negative for confusion and dysphoric mood.       Objective:   Physical Exam  Constitutional: He is oriented to person, place, and time. He appears well-developed and well-nourished. No distress.  HENT:  Head: Normocephalic and atraumatic.  Right Ear: External ear normal.  Left Ear: External ear normal.  Mouth/Throat: Oropharynx is clear and moist.  Eyes: Conjunctivae and EOM are normal. Pupils are equal, round, and reactive to light.  Neck: Normal range of motion. Neck supple. No thyromegaly present.  Cardiovascular: Normal rate, regular rhythm and normal heart sounds.   No murmur heard. Pulmonary/Chest: No respiratory distress. He has no wheezes. He has no rales.  Abdominal: Soft. Bowel sounds are normal. He exhibits no distension and no mass. There is no tenderness. There is no rebound and no guarding.  Musculoskeletal: He exhibits no edema.  Lymphadenopathy:    He has no cervical adenopathy.  Neurological: He is alert and oriented to person, place, and time. He displays normal reflexes. No cranial nerve deficit.  Skin: No rash noted.  Psychiatric: He has a normal mood and affect.          Assessment & Plan:  Complete physical. Smoking cessation discussed. Trial of Zyban one twice daily with prescription written instructions given. Spent 5 minutes with smoking cessation counseling. Labs reviewed. Lipids improved.  Continue regular exercise.  Recommended continue use of baby aspirin and omega-3 supplement

## 2011-08-15 ENCOUNTER — Encounter: Payer: Self-pay | Admitting: Family Medicine

## 2011-08-16 ENCOUNTER — Ambulatory Visit (INDEPENDENT_AMBULATORY_CARE_PROVIDER_SITE_OTHER): Payer: BC Managed Care – PPO | Admitting: Family Medicine

## 2011-08-16 ENCOUNTER — Encounter: Payer: Self-pay | Admitting: Family Medicine

## 2011-08-16 VITALS — BP 120/82 | HR 62 | Temp 97.4°F | Wt 240.0 lb

## 2011-08-16 DIAGNOSIS — H109 Unspecified conjunctivitis: Secondary | ICD-10-CM

## 2011-08-16 MED ORDER — TOBRAMYCIN-DEXAMETHASONE 0.3-0.1 % OP SUSP: 1.0000 [drp] | OPHTHALMIC | Status: AC

## 2011-08-16 NOTE — Progress Notes (Signed)
  Subjective:    Patient ID: Brett Morgan, male    DOB: May 10, 1971, 41 y.o.   MRN: [id number]  HPI Here for 2 days of swelling, redness, and irritation of the right eye. No vision problems or eye pain. No sinus symptoms or fever. Some yellow crusty drainage form the eye.    Review of Systems  Constitutional: Negative.   HENT: Negative.   Eyes: Positive for discharge, redness and itching. Negative for photophobia, pain and visual disturbance.  Respiratory: Negative.        Objective:   Physical Exam  Constitutional: He appears well-developed and well-nourished.  HENT:  Right Ear: External ear normal.  Left Ear: External ear normal.  Nose: Nose normal.  Mouth/Throat: Oropharynx is clear and moist. No oropharyngeal exudate.  Eyes:       Right conjunctiva is red and both lids are a bit swollen. The cornea is clear. There is some yellow DC in the corner.   Neck: Neck supple. No thyromegaly present.  Pulmonary/Chest: Effort normal and breath sounds normal.  Lymphadenopathy:    He has no cervical adenopathy.          Assessment & Plan:  Recheck prn

## 2011-09-26 ENCOUNTER — Other Ambulatory Visit: Payer: Self-pay | Admitting: Otolaryngology

## 2011-10-08 ENCOUNTER — Encounter (HOSPITAL_COMMUNITY): Payer: Self-pay | Admitting: Pharmacy Technician

## 2011-10-13 ENCOUNTER — Encounter (HOSPITAL_COMMUNITY): Payer: Self-pay

## 2011-10-13 ENCOUNTER — Ambulatory Visit (HOSPITAL_COMMUNITY)
Admission: RE | Admit: 2011-10-13 | Discharge: 2011-10-13 | Disposition: A | Discharge status: Home / Self Care | Payer: BC Managed Care – PPO | Source: Ambulatory Visit | Attending: Otolaryngology | Admitting: Otolaryngology

## 2011-10-13 ENCOUNTER — Encounter (HOSPITAL_COMMUNITY)
Admission: RE | Admit: 2011-10-13 | Discharge: 2011-10-13 | Disposition: A | Discharge status: Home / Self Care | Payer: BC Managed Care – PPO | Source: Ambulatory Visit | Attending: Specialist | Admitting: Specialist

## 2011-10-13 DIAGNOSIS — Z01818 Encounter for other preprocedural examination: Secondary | ICD-10-CM | POA: Insufficient documentation

## 2011-10-13 DIAGNOSIS — M5137 Other intervertebral disc degeneration, lumbosacral region: Secondary | ICD-10-CM | POA: Insufficient documentation

## 2011-10-13 DIAGNOSIS — M51379 Other intervertebral disc degeneration, lumbosacral region without mention of lumbar back pain or lower extremity pain: Secondary | ICD-10-CM | POA: Insufficient documentation

## 2011-10-13 DIAGNOSIS — Z01812 Encounter for preprocedural laboratory examination: Secondary | ICD-10-CM | POA: Insufficient documentation

## 2011-10-13 HISTORY — DX: Low back pain, unspecified: M54.50

## 2011-10-13 HISTORY — DX: Low back pain: M54.5

## 2011-10-13 LAB — COMPREHENSIVE METABOLIC PANEL
Alkaline Phosphatase: 90 U/L (ref 39–117)
BUN: 10 mg/dL (ref 6–23)
CO2: 26 mEq/L (ref 19–32)
Chloride: 101 mEq/L (ref 96–112)
Creatinine, Ser: 0.81 mg/dL (ref 0.50–1.35)
GFR calc Af Amer: >90 mL/min (ref >90)
GFR calc non Af Amer: >90 mL/min (ref >90)
Glucose, Bld: 91 mg/dL (ref 70–99)
Potassium: 4 mEq/L (ref 3.5–5.1)
Total Bilirubin: 0.1 mg/dL — ABNORMAL LOW (ref 0.3–1.2)

## 2011-10-13 LAB — DIFFERENTIAL
Basophils Absolute: 0 10*3/uL (ref 0.0–0.1)
Basophils Relative: 0 % (ref 0–1)
Eosinophils Absolute: 0.2 10*3/uL (ref 0.0–0.7)
Monocytes Relative: 7 % (ref 3–12)
Neutro Abs: 3.4 10*3/uL (ref 1.7–7.7)
Neutrophils Relative %: 50 % (ref 43–77)

## 2011-10-13 LAB — URINALYSIS, ROUTINE W REFLEX MICROSCOPIC
Hgb urine dipstick: NEGATIVE
Nitrite: NEGATIVE
Protein, ur: NEGATIVE mg/dL
Urobilinogen, UA: 0.2 mg/dL (ref 0.0–1.0)

## 2011-10-13 LAB — SURGICAL PCR SCREEN
MRSA, PCR: NEGATIVE
Staphylococcus aureus: NEGATIVE

## 2011-10-13 LAB — CBC
HCT: 40.7 % (ref 39.0–52.0)
Hemoglobin: 14.3 g/dL (ref 13.0–17.0)
MCV: 92.1 fL (ref 78.0–100.0)
WBC: 6.9 10*3/uL (ref 4.0–10.5)

## 2011-10-13 NOTE — Pre-Procedure Instructions (Signed)
CBC WITH DIFF, CMET, UA, AND LUMBAR SPINE XRAY WERE DONE TODAY AT [facility].  CXR AND EKG NOT NEEDED-PER ANESTHESIOLOGIST'S GUIDELINES.

## 2011-10-13 NOTE — Patient Instructions (Signed)
YOUR SURGERY IS SCHEDULED ON:  Thursday  5/16  AT 7:30 AM  REPORT TO [place] [facility] AT:  5:30 AM      PHONE # FOR SHORT STAY IS [phone]  DO NOT EAT OR DRINK ANYTHING AFTER MIDNIGHT THE NIGHT BEFORE YOUR SURGERY.  YOU MAY BRUSH YOUR TEETH, RINSE OUT YOUR MOUTH--BUT NO WATER, NO FOOD, NO CHEWING GUM, NO MINTS, NO CANDIES, NO CHEWING TOBACCO.  PLEASE TAKE THE FOLLOWING MEDICATIONS THE AM OF YOUR SURGERY WITH A FEW SIPS OF WATER:  BUPROPION, OXYCODONE/ACETAMINOPHEN IF NEEDED, WEAR NICOTINE PATCH AND BRING ALBUTEROL INHALER TO TAKE TO SURGERY.    IF YOU USE INHALERS--USE YOUR INHALERS THE AM OF YOUR SURGERY AND BRING INHALERS TO THE HOSPITAL -TAKE TO SURGERY.    IF YOU ARE DIABETIC:  DO NOT TAKE ANY DIABETIC MEDICATIONS THE AM OF YOUR SURGERY.  IF YOU TAKE INSULIN IN THE EVENINGS--PLEASE ONLY TAKE 1/2 NORMAL EVENING DOSE THE NIGHT BEFORE YOUR SURGERY.  NO INSULIN THE AM OF YOUR SURGERY.  IF YOU HAVE SLEEP APNEA AND USE CPAP OR BIPAP--PLEASE BRING THE MASK --NOT THE MACHINE-NOT THE TUBING   -JUST THE MASK. DO NOT BRING VALUABLES, MONEY, CREDIT CARDS.  CONTACT LENS, DENTURES / PARTIALS, GLASSES SHOULD NOT BE WORN TO SURGERY AND IN MOST CASES-HEARING AIDS WILL NEED TO BE REMOVED.  BRING YOUR GLASSES CASE, ANY EQUIPMENT NEEDED FOR YOUR CONTACT LENS. FOR PATIENTS ADMITTED TO THE HOSPITAL--CHECK OUT TIME THE DAY OF DISCHARGE IS 11:00 AM.  ALL INPATIENT ROOMS ARE PRIVATE - WITH BATHROOM, TELEPHONE, TELEVISION AND WIFI INTERNET. IF YOU ARE BEING DISCHARGED THE SAME DAY OF YOUR SURGERY--YOU CAN NOT DRIVE YOURSELF HOME--AND SHOULD NOT GO HOME ALONE BY TAXI OR BUS.  NO DRIVING OR OPERATING MACHINERY FOR 24 HOURS FOLLOWING ANESTHESIA / PAIN MEDICATIONS.                            SPECIAL INSTRUCTIONS:  CHLORHEXIDINE SOAP SHOWER (other brand names are Betasept and Hibiclens ) PLEASE SHOWER WITH CHLORHEXIDINE THE NIGHT BEFORE YOUR SURGERY AND THE AM OF YOUR SURGERY. DO NOT USE CHLORHEXIDINE  ON YOUR FACE OR PRIVATE AREAS--YOU MAY USE YOUR NORMAL SOAP THOSE AREAS AND YOUR NORMAL SHAMPOO.  WOMEN SHOULD AVOID SHAVING UNDER ARMS AND SHAVING LEGS 48 HOURS BEFORE USING CHLORHEXIDINE TO AVOID SKIN IRRITATION.  DO NOT USE IF ALLERGIC TO CHLORHEXIDINE.  PLEASE READ OVER ANY  FACT SHEETS THAT YOU WERE GIVEN: MRSA INFORMATION, INCENTIVE SPIROMETER INFORMATION.

## 2011-10-15 NOTE — Anesthesia Preprocedure Evaluation (Addendum)
Anesthesia Evaluation  Patient identified by MRN, date of birth, ID band Patient awake    Reviewed: Allergy & Precautions, H&P , NPO status , Patient's Chart, lab work & pertinent test results  Airway Mallampati: II TM Distance: >3 FB Neck ROM: Full    Dental No notable dental hx. (+) Teeth Intact and Dental Advisory Given   Pulmonary asthma , Current Smoker (25 pack years),  breath sounds clear to auscultation  Pulmonary exam normal       Cardiovascular negative cardio ROS  Rhythm:Regular Rate:Normal     Neuro/Psych negative neurological ROS  negative psych ROS   GI/Hepatic negative GI ROS, Neg liver ROS,   Endo/Other  negative endocrine ROSMorbid obesity  Renal/GU negative Renal ROSUrinary calculi  negative genitourinary   Musculoskeletal negative musculoskeletal ROS (+)   Abdominal (+) + obese,   Peds  Hematology negative hematology ROS (+)   Anesthesia Other Findings   Reproductive/Obstetrics negative OB ROS                          Anesthesia Physical Anesthesia Plan  ASA: II  Anesthesia Plan: General   Post-op Pain Management:    Induction: Intravenous  Airway Management Planned: Oral ETT  Additional Equipment:   Intra-op Plan:   Post-operative Plan: Extubation in OR  Informed Consent: I have reviewed the patients History and Physical, chart, labs and discussed the procedure including the risks, benefits and alternatives for the proposed anesthesia with the patient or authorized representative who has indicated his/her understanding and acceptance.   Dental advisory given  Plan Discussed with: CRNA  Anesthesia Plan Comments:         Anesthesia Quick Evaluation

## 2011-10-16 ENCOUNTER — Ambulatory Visit (HOSPITAL_COMMUNITY): Payer: BC Managed Care – PPO

## 2011-10-16 ENCOUNTER — Encounter (HOSPITAL_COMMUNITY): Payer: Self-pay | Admitting: Anesthesiology

## 2011-10-16 ENCOUNTER — Encounter (HOSPITAL_COMMUNITY)
Admission: RE | Disposition: A | Discharge status: Home / Self Care | Payer: Self-pay | Source: Ambulatory Visit | Attending: Specialist

## 2011-10-16 ENCOUNTER — Encounter (HOSPITAL_COMMUNITY): Payer: Self-pay | Admitting: *Deleted

## 2011-10-16 ENCOUNTER — Ambulatory Visit (HOSPITAL_COMMUNITY): Payer: BC Managed Care – PPO | Admitting: Anesthesiology

## 2011-10-16 ENCOUNTER — Observation Stay (HOSPITAL_COMMUNITY)
Admission: RE | Admit: 2011-10-16 | Discharge: 2011-10-17 | Disposition: A | Discharge status: Home / Self Care | Payer: BC Managed Care – PPO | Source: Ambulatory Visit | Attending: Specialist | Admitting: Specialist

## 2011-10-16 DIAGNOSIS — F17200 Nicotine dependence, unspecified, uncomplicated: Secondary | ICD-10-CM | POA: Insufficient documentation

## 2011-10-16 DIAGNOSIS — IMO0002 Reserved for concepts with insufficient information to code with codable children: Secondary | ICD-10-CM

## 2011-10-16 DIAGNOSIS — Z79899 Other long term (current) drug therapy: Secondary | ICD-10-CM | POA: Insufficient documentation

## 2011-10-16 DIAGNOSIS — J45909 Unspecified asthma, uncomplicated: Secondary | ICD-10-CM | POA: Insufficient documentation

## 2011-10-16 DIAGNOSIS — E785 Hyperlipidemia, unspecified: Secondary | ICD-10-CM | POA: Insufficient documentation

## 2011-10-16 DIAGNOSIS — M5126 Other intervertebral disc displacement, lumbar region: Principal | ICD-10-CM | POA: Insufficient documentation

## 2011-10-16 DIAGNOSIS — Z87442 Personal history of urinary calculi: Secondary | ICD-10-CM | POA: Insufficient documentation

## 2011-10-16 HISTORY — PX: LUMBAR LAMINECTOMY/DECOMPRESSION MICRODISCECTOMY: SHX5026

## 2011-10-16 SURGERY — LUMBAR LAMINECTOMY/DECOMPRESSION MICRODISCECTOMY
Anesthesia: General | Site: Spine Lumbar | Laterality: Right | Wound class: Clean

## 2011-10-16 MED ORDER — ACETAMINOPHEN 10 MG/ML IV SOLN
INTRAVENOUS | Status: DC | PRN
  Administered 2011-10-16: 1000 mg via INTRAVENOUS

## 2011-10-16 MED ORDER — CEFAZOLIN SODIUM-DEXTROSE 2-3 GM-% IV SOLR
INTRAVENOUS | Status: AC
  Filled 2011-10-16: qty 50

## 2011-10-16 MED ORDER — POTASSIUM CHLORIDE IN NACL 20-0.9 MEQ/L-% IV SOLN
INTRAVENOUS | Status: DC
  Administered 2011-10-16: 1000 mL via INTRAVENOUS
  Administered 2011-10-16: 20:00:00 via INTRAVENOUS
  Filled 2011-10-16 (×3): qty 1000

## 2011-10-16 MED ORDER — METHOCARBAMOL 100 MG/ML IJ SOLN
500.0000 mg | Freq: Four times a day (QID) | INTRAVENOUS | Status: DC | PRN
  Administered 2011-10-16: 500 mg via INTRAVENOUS
  Filled 2011-10-16: qty 5

## 2011-10-16 MED ORDER — THROMBIN 5000 UNITS EX SOLR
CUTANEOUS | Status: DC | PRN
  Administered 2011-10-16: 10000 [IU] via TOPICAL

## 2011-10-16 MED ORDER — DEXAMETHASONE SODIUM PHOSPHATE 10 MG/ML IJ SOLN
INTRAMUSCULAR | Status: DC | PRN
  Administered 2011-10-16: 10 mg via INTRAVENOUS

## 2011-10-16 MED ORDER — NICOTINE 14 MG/24HR TD PT24
14.0000 mg | MEDICATED_PATCH | TRANSDERMAL | Status: DC
  Administered 2011-10-17: 14 mg via TRANSDERMAL
  Filled 2011-10-16: qty 1

## 2011-10-16 MED ORDER — THROMBIN 5000 UNITS EX SOLR
CUTANEOUS | Status: AC
  Filled 2011-10-16: qty 5000

## 2011-10-16 MED ORDER — POTASSIUM CHLORIDE IN NACL 20-0.9 MEQ/L-% IV SOLN
INTRAVENOUS | Status: AC
  Filled 2011-10-16: qty 1000

## 2011-10-16 MED ORDER — ACETAMINOPHEN 10 MG/ML IV SOLN
INTRAVENOUS | Status: AC
  Filled 2011-10-16: qty 100

## 2011-10-16 MED ORDER — OXYCODONE-ACETAMINOPHEN 5-325 MG PO TABS: 2.0000 | ORAL_TABLET | ORAL | Status: DC | PRN

## 2011-10-16 MED ORDER — FENTANYL CITRATE 0.05 MG/ML IJ SOLN
25.0000 ug | INTRAMUSCULAR | Status: DC | PRN
  Administered 2011-10-16 (×4): 25 ug via INTRAVENOUS

## 2011-10-16 MED ORDER — MENTHOL 3 MG MT LOZG: 1.0000 | LOZENGE | OROMUCOSAL | Status: DC | PRN

## 2011-10-16 MED ORDER — LACTATED RINGERS IV SOLN: INTRAVENOUS | Status: DC

## 2011-10-16 MED ORDER — PANTOPRAZOLE SODIUM 40 MG IV SOLR
40.0000 mg | Freq: Every day | INTRAVENOUS | Status: DC
  Administered 2011-10-16: 40 mg via INTRAVENOUS
  Filled 2011-10-16 (×2): qty 40

## 2011-10-16 MED ORDER — BISACODYL 10 MG RE SUPP: 10.0000 mg | Freq: Every day | RECTAL | Status: DC | PRN

## 2011-10-16 MED ORDER — HYDROMORPHONE HCL PF 1 MG/ML IJ SOLN: 0.5000 mg | INTRAMUSCULAR | Status: DC | PRN

## 2011-10-16 MED ORDER — ALBUTEROL SULFATE HFA 108 (90 BASE) MCG/ACT IN AERS: 2.0000 | INHALATION_SPRAY | RESPIRATORY_TRACT | Status: DC | PRN

## 2011-10-16 MED ORDER — SODIUM CHLORIDE 0.9 % IJ SOLN
3.0000 mL | Freq: Two times a day (BID) | INTRAMUSCULAR | Status: DC
  Administered 2011-10-16: 3 mL via INTRAVENOUS

## 2011-10-16 MED ORDER — ONDANSETRON HCL 4 MG/2ML IJ SOLN
INTRAMUSCULAR | Status: DC | PRN
  Administered 2011-10-16: 4 mg via INTRAVENOUS

## 2011-10-16 MED ORDER — CISATRACURIUM BESYLATE (PF) 10 MG/5ML IV SOLN
INTRAVENOUS | Status: DC | PRN
  Administered 2011-10-16: 10 mg via INTRAVENOUS

## 2011-10-16 MED ORDER — BUPROPION HCL ER (SMOKING DET) 150 MG PO TB12
150.0000 mg | ORAL_TABLET | Freq: Two times a day (BID) | ORAL | Status: DC
  Administered 2011-10-16 – 2011-10-17 (×2): 150 mg via ORAL
  Filled 2011-10-16 (×4): qty 1

## 2011-10-16 MED ORDER — PROMETHAZINE HCL 25 MG/ML IJ SOLN: 6.2500 mg | INTRAMUSCULAR | Status: DC | PRN

## 2011-10-16 MED ORDER — BUPIVACAINE-EPINEPHRINE (PF) 0.5% -1:200000 IJ SOLN
INTRAMUSCULAR | Status: AC
  Filled 2011-10-16: qty 10

## 2011-10-16 MED ORDER — LACTATED RINGERS IV SOLN
INTRAVENOUS | Status: DC
  Administered 2011-10-16 (×2): via INTRAVENOUS

## 2011-10-16 MED ORDER — METHOCARBAMOL 500 MG PO TABS: 500.0000 mg | ORAL_TABLET | Freq: Four times a day (QID) | ORAL | Status: DC | PRN

## 2011-10-16 MED ORDER — FENTANYL CITRATE 0.05 MG/ML IJ SOLN
INTRAMUSCULAR | Status: AC
  Filled 2011-10-16: qty 2

## 2011-10-16 MED ORDER — SODIUM CHLORIDE 0.9 % IR SOLN
Status: DC | PRN
  Administered 2011-10-16: 09:00:00

## 2011-10-16 MED ORDER — FENTANYL CITRATE 0.05 MG/ML IJ SOLN
INTRAMUSCULAR | Status: DC | PRN
  Administered 2011-10-16: 100 ug via INTRAVENOUS

## 2011-10-16 MED ORDER — METHOCARBAMOL 500 MG PO TABS
500.0000 mg | ORAL_TABLET | Freq: Four times a day (QID) | ORAL | Status: DC | PRN
  Administered 2011-10-16 – 2011-10-17 (×2): 500 mg via ORAL
  Filled 2011-10-16 (×3): qty 1

## 2011-10-16 MED ORDER — BUPIVACAINE-EPINEPHRINE 0.5% -1:200000 IJ SOLN
INTRAMUSCULAR | Status: DC | PRN
  Administered 2011-10-16: 14 mL

## 2011-10-16 MED ORDER — PROPOFOL 10 MG/ML IV EMUL
INTRAVENOUS | Status: DC | PRN
  Administered 2011-10-16: 180 mg via INTRAVENOUS

## 2011-10-16 MED ORDER — SODIUM CHLORIDE 0.9 % IJ SOLN: 3.0000 mL | INTRAMUSCULAR | Status: DC | PRN

## 2011-10-16 MED ORDER — PHENOL 1.4 % MT LIQD: 1.0000 | OROMUCOSAL | Status: DC | PRN

## 2011-10-16 MED ORDER — DOCUSATE SODIUM 100 MG PO CAPS
100.0000 mg | ORAL_CAPSULE | Freq: Two times a day (BID) | ORAL | Status: DC
  Administered 2011-10-16 – 2011-10-17 (×3): 100 mg via ORAL

## 2011-10-16 MED ORDER — CEFAZOLIN SODIUM-DEXTROSE 2-3 GM-% IV SOLR
2.0000 g | INTRAVENOUS | Status: AC
  Administered 2011-10-16: 2 g via INTRAVENOUS

## 2011-10-16 MED ORDER — OXYCODONE-ACETAMINOPHEN 5-325 MG PO TABS
1.0000 | ORAL_TABLET | ORAL | Status: DC | PRN
  Administered 2011-10-16 – 2011-10-17 (×6): 1 via ORAL
  Filled 2011-10-16 (×6): qty 1

## 2011-10-16 MED ORDER — CEFAZOLIN SODIUM-DEXTROSE 2-3 GM-% IV SOLR
2.0000 g | Freq: Three times a day (TID) | INTRAVENOUS | Status: AC
  Administered 2011-10-16 – 2011-10-17 (×2): 2 g via INTRAVENOUS
  Filled 2011-10-16 (×2): qty 50

## 2011-10-16 MED ORDER — CHLORHEXIDINE GLUCONATE 4 % EX LIQD
60.0000 mL | Freq: Once | CUTANEOUS | Status: DC
  Filled 2011-10-16: qty 60

## 2011-10-16 MED ORDER — ONDANSETRON HCL 4 MG/2ML IJ SOLN: 4.0000 mg | INTRAMUSCULAR | Status: DC | PRN

## 2011-10-16 MED ORDER — ACETAMINOPHEN 325 MG PO TABS: 650.0000 mg | ORAL_TABLET | ORAL | Status: DC | PRN

## 2011-10-16 MED ORDER — ACETAMINOPHEN 650 MG RE SUPP: 650.0000 mg | RECTAL | Status: DC | PRN

## 2011-10-16 MED ORDER — MIDAZOLAM HCL 5 MG/5ML IJ SOLN
INTRAMUSCULAR | Status: DC | PRN
  Administered 2011-10-16: 2 mg via INTRAVENOUS

## 2011-10-16 MED ORDER — SODIUM CHLORIDE 0.9 % IV SOLN: 250.0000 mL | INTRAVENOUS | Status: DC

## 2011-10-16 SURGICAL SUPPLY — 63 items
APL SKNCLS STERI-STRIP NONHPOA (GAUZE/BANDAGES/DRESSINGS) ×2
BAG SPEC THK2 15X12 ZIP CLS (MISCELLANEOUS) ×1
BAG ZIPLOCK 12X15 (MISCELLANEOUS) ×2 IMPLANT
BENZOIN TINCTURE PRP APPL 2/3 (GAUZE/BANDAGES/DRESSINGS) ×4 IMPLANT
CHLORAPREP W/TINT 26ML (MISCELLANEOUS) IMPLANT
CLEANER TIP ELECTROSURG 2X2 (MISCELLANEOUS) ×2 IMPLANT
CLOTH BEACON ORANGE TIMEOUT ST (SAFETY) ×2 IMPLANT
CONT SPEC 4OZ CLIKSEAL STRL BL (MISCELLANEOUS) IMPLANT
CONT SPECI 4OZ STER CLIK (MISCELLANEOUS) ×1 IMPLANT
DECANTER SPIKE VIAL GLASS SM (MISCELLANEOUS) ×2 IMPLANT
DRAPE MICROSCOPE LEICA (MISCELLANEOUS) ×2 IMPLANT
DRAPE POUCH INSTRU U-SHP 10X18 (DRAPES) ×2 IMPLANT
DRAPE SURG 17X11 SM STRL (DRAPES) ×2 IMPLANT
DRSG EMULSION OIL 3X3 NADH (GAUZE/BANDAGES/DRESSINGS) IMPLANT
DRSG PAD ABDOMINAL 8X10 ST (GAUZE/BANDAGES/DRESSINGS) IMPLANT
DRSG TELFA 4X5 ISLAND ADH (GAUZE/BANDAGES/DRESSINGS) ×2 IMPLANT
DURAPREP 26ML APPLICATOR (WOUND CARE) ×2 IMPLANT
ELECT BLADE TIP CTD 4 INCH (ELECTRODE) ×1 IMPLANT
ELECT REM PT RETURN 9FT ADLT (ELECTROSURGICAL) ×2
ELECTRODE REM PT RTRN 9FT ADLT (ELECTROSURGICAL) ×1 IMPLANT
GLOVE BIOGEL PI IND STRL 6.5 (GLOVE) IMPLANT
GLOVE BIOGEL PI IND STRL 8 (GLOVE) ×1 IMPLANT
GLOVE BIOGEL PI INDICATOR 6.5 (GLOVE) ×1
GLOVE BIOGEL PI INDICATOR 8 (GLOVE)
GLOVE ECLIPSE 6.5 STRL STRAW (GLOVE) ×2 IMPLANT
GLOVE INDICATOR 6.5 STRL GRN (GLOVE) ×3 IMPLANT
GLOVE SURG SS PI 6.5 STRL IVOR (GLOVE) ×1 IMPLANT
GLOVE SURG SS PI 7.5 STRL IVOR (GLOVE) ×3 IMPLANT
GLOVE SURG SS PI 8.0 STRL IVOR (GLOVE) ×4 IMPLANT
GOWN PREVENTION PLUS LG XLONG (DISPOSABLE) ×4 IMPLANT
GOWN STRL NON-REIN LRG LVL3 (GOWN DISPOSABLE) ×1 IMPLANT
GOWN STRL REIN XL XLG (GOWN DISPOSABLE) ×2 IMPLANT
KIT BASIN OR (CUSTOM PROCEDURE TRAY) ×2 IMPLANT
KIT POSITIONING SURG ANDREWS (MISCELLANEOUS) ×2 IMPLANT
MANIFOLD NEPTUNE II (INSTRUMENTS) ×2 IMPLANT
NDL SPNL 18GX3.5 QUINCKE PK (NEEDLE) ×3 IMPLANT
NEEDLE SPNL 18GX3.5 QUINCKE PK (NEEDLE) ×4 IMPLANT
PATTIES SURGICAL .5 X.5 (GAUZE/BANDAGES/DRESSINGS) IMPLANT
PATTIES SURGICAL .75X.75 (GAUZE/BANDAGES/DRESSINGS) IMPLANT
PATTIES SURGICAL 1X1 (DISPOSABLE) IMPLANT
SPONGE LAP 18X18 X RAY DECT (DISPOSABLE) ×2 IMPLANT
SPONGE LAP 4X18 X RAY DECT (DISPOSABLE) ×1 IMPLANT
SPONGE SURGIFOAM ABS GEL 100 (HEMOSTASIS) ×2 IMPLANT
STAPLER VISISTAT (STAPLE) IMPLANT
STRIP CLOSURE SKIN 1/2X4 (GAUZE/BANDAGES/DRESSINGS) ×1 IMPLANT
SUT PROLENE 3 0 PS 2 (SUTURE) ×2 IMPLANT
SUT VIC AB 0 CT1 27 (SUTURE)
SUT VIC AB 0 CT1 27XBRD ANTBC (SUTURE) IMPLANT
SUT VIC AB 1 CT1 27 (SUTURE) ×2
SUT VIC AB 1 CT1 27XBRD ANTBC (SUTURE) ×1 IMPLANT
SUT VIC AB 1-0 CT2 27 (SUTURE) ×1 IMPLANT
SUT VIC AB 2-0 CT1 27 (SUTURE) ×2
SUT VIC AB 2-0 CT1 TAPERPNT 27 (SUTURE) ×1 IMPLANT
SUT VIC AB 2-0 CT2 27 (SUTURE) ×2 IMPLANT
SUT VICRYL 0 27 CT2 27 ABS (SUTURE) ×2 IMPLANT
SUT VICRYL 0 UR6 27IN ABS (SUTURE) IMPLANT
SYR 20CC LL (SYRINGE) ×1 IMPLANT
SYRINGE 10CC LL (SYRINGE) ×2 IMPLANT
TAPE STRIPS DRAPE STRL (GAUZE/BANDAGES/DRESSINGS) ×1 IMPLANT
TOWEL OR 17X26 10 PK STRL BLUE (TOWEL DISPOSABLE) ×2 IMPLANT
TOWEL OR NON WOVEN STRL DISP B (DISPOSABLE) ×1 IMPLANT
TRAY LAMINECTOMY (CUSTOM PROCEDURE TRAY) ×2 IMPLANT
YANKAUER SUCT BULB TIP NO VENT (SUCTIONS) ×2 IMPLANT

## 2011-10-16 NOTE — Brief Op Note (Signed)
10/16/2011  9:05 AM  PATIENT:  Brett Morgan  41 y.o. male  PRE-OPERATIVE DIAGNOSIS:  herniated disc l4-5 on right  POST-OPERATIVE DIAGNOSIS:  herniated disc l4-5 on right  PROCEDURE:  Procedure(s) (LRB): LUMBAR LAMINECTOMY/DECOMPRESSION MICRODISCECTOMY (Right)  SURGEON:  Surgeon(s) and Role:    * Javier Docker, MD - Primary  PHYSICIAN ASSISTANT:   ASSISTANTS: strader    ANESTHESIA:   general  EBL:  Total I/O In: 1000 [I.V.:1000] Out: -   BLOOD ADMINISTERED:none  DRAINS: none   LOCAL MEDICATIONS USED:  MARCAINE     SPECIMEN:  Source of Specimen:  L45 disc  DISPOSITION OF SPECIMEN:  PATHOLOGY  COUNTS:  YES  TOURNIQUET:  * No tourniquets in log *  DICTATION: .Other Dictation: Dictation Number [id number]  PLAN OF CARE: Admit for overnight observation  PATIENT DISPOSITION:  PACU - hemodynamically stable.   Delay start of Pharmacological VTE agent (>24hrs) due to surgical blood loss or risk of bleeding: yes

## 2011-10-16 NOTE — Transfer of Care (Signed)
Immediate Anesthesia Transfer of Care Note  Patient: Brett Morgan  Procedure(s) Performed: Procedure(s) (LRB): LUMBAR LAMINECTOMY/DECOMPRESSION MICRODISCECTOMY (Right)  Patient Location: PACU  Anesthesia Type: General  Level of Consciousness: awake, sedated and patient cooperative  Airway & Oxygen Therapy: Patient Spontanous Breathing and Patient connected to face mask oxygen  Post-op Assessment: Report given to PACU RN and Post -op Vital signs reviewed and stable  Post vital signs: Reviewed and stable  Complications: No apparent anesthesia complications

## 2011-10-16 NOTE — H&P (Signed)
Brett Morgan is an 41 y.o. male.   Chief Complaint: right leg pain HPI: HNP L45 right refractory.  Past Medical History  Diagnosis Date  . ASTHMA 04/18/2009    Qualifier: Diagnosis of  By: Gabriel Rung LPN, Harriett Sine    . Personal history of urinary calculi 04/18/2009    Qualifier: Diagnosis of  By: Gabriel Rung LPN, Harriett Sine    . HYPERLIPIDEMIA 06/06/2010    Qualifier: Diagnosis of  By: Caryl Never MD, [name]    . Lumbar pain     pt has hnp l4-5    Past Surgical History  Procedure Date  . Tonsillectomy   . Tonsillectomy   . Knee surgery     arthroscopic left    Family History  Problem Relation Age of Onset  . Heart disease Father 28    CAD  . Arthritis Other   . Hypertension Other   . Cancer Other     grandparent, lung  . Stroke Other     grandparent   Social History:  reports that he has been smoking Cigarettes and Cigars.  He has a 25 pack-year smoking history. He has quit using smokeless tobacco. He reports that he drinks alcohol. He reports that he does not use illicit drugs.  Allergies: No Known Allergies  Medications Prior to Admission  Medication Sig Dispense Refill  . albuterol (PROVENTIL HFA) 108 (90 BASE) MCG/ACT inhaler Inhale 2 puffs into the lungs every 4 (four) hours as needed. For wheezing       . buPROPion (ZYBAN) 150 MG 12 hr tablet Take 150 mg by mouth 2 (two) times daily.      . fish oil-omega-3 fatty acids 1000 MG capsule Take 1-2 g by mouth 2 (two) times daily.      . Magnesium 250 MG TABS Take 1 tablet by mouth daily.      . methocarbamol (ROBAXIN) 500 MG tablet Take 500 mg by mouth every 6 (six) hours as needed. For muscle spasm       . Multiple Vitamin (MULITIVITAMIN WITH MINERALS) TABS Take 1 tablet by mouth daily.      . nicotine (NICODERM CQ - DOSED IN MG/24 HOURS) 14 mg/24hr patch Place 1 patch onto the skin daily.      [name] Kitchen oxyCODONE-acetaminophen (PERCOCET) 5-325 MG per tablet Take 1 tablet by mouth every 4 (four) hours as needed. For pain      . Plant  Sterol Stanol-Pantethine (CHOLEST OFF COMPLETE) 450-75 MG TABS Take 1 tablet by mouth 2 (two) times daily.      . Potassium 99 MG TABS Take 1 tablet by mouth daily.      [name] Kitchen acetaminophen (TYLENOL) 500 MG tablet Take 1,000 mg by mouth every 6 (six) hours as needed.      [name] Kitchen aspirin EC 81 MG tablet Take 81 mg by mouth 2 (two) times daily.        No results found for this or any previous visit (from the past 48 hour(s)). No results found.  Review of Systems  Musculoskeletal: Positive for back pain.  Neurological: Positive for sensory change and focal weakness.  All other systems reviewed and are negative.    Blood pressure 134/85, pulse 91, temperature 97.7 F (36.5 C), temperature source Oral, resp. rate 18, weight 102.57 kg (226 lb 2 oz), SpO2 96.00%. Physical Exam  Vitals reviewed. Constitutional: He is oriented to person, place, and time. He appears well-developed.  HENT:  Head: Normocephalic.  Eyes: Pupils are equal, round, and reactive to  light.  Neck: Normal range of motion.  Cardiovascular: Normal rate.   Respiratory: Effort normal.  GI: Soft.  Musculoskeletal:       SLR + right at 20. EHL 4/5 right  Neurological: He is alert and oriented to person, place, and time.  Skin: Skin is warm.  Psychiatric: He has a normal mood and affect.     Assessment/Plan Refractory HNP L45 right. Plan decompression L45 right. Risks and benefits discussed.  Gilberto Streck C 10/16/2011, 7:14 AM

## 2011-10-16 NOTE — Progress Notes (Signed)
Tolerating Coke and crackers without N/V.

## 2011-10-16 NOTE — Anesthesia Postprocedure Evaluation (Signed)
Anesthesia Post Note  Patient: Brett Morgan  Procedure(s) Performed: Procedure(s) (LRB): LUMBAR LAMINECTOMY/DECOMPRESSION MICRODISCECTOMY (Right)  Anesthesia type: General  Patient location: PACU  Post pain: Pain level controlled  Post assessment: Post-op Vital signs reviewed  Last Vitals:  Filed Vitals:   10/16/11 0915  BP: 117/59  Pulse: 64  Temp: 36.1 C  Resp: 12    Post vital signs: Reviewed  Level of consciousness: sedated  Complications: No apparent anesthesia complications

## 2011-10-17 MED ORDER — PANTOPRAZOLE SODIUM 40 MG PO TBEC
40.0000 mg | DELAYED_RELEASE_TABLET | Freq: Every day | ORAL | Status: DC
  Filled 2011-10-17: qty 1

## 2011-10-17 NOTE — Op Note (Signed)
NAME:  Brett Morgan, Brett Morgan NO.:  [id number]  MEDICAL RECORD NO.:  [id number]  LOCATION:  1621                         FACILITY:  [facility]  PHYSICIAN:  Jene Every, M.D.    DATE OF BIRTH:  [birth year]  DATE OF PROCEDURE:  10/16/2011 DATE OF DISCHARGE:  10/17/2011                              OPERATIVE REPORT   PREOPERATIVE DIAGNOSIS:  Herniated nucleus pulposus, L4-5, right.  POSTOPERATIVE DIAGNOSIS:  Herniated nucleus pulposus, L4-5, right.  PROCEDURE PERFORMED: 1. Microlumbar decompression, L4-5 right. 2. Foraminotomies of L5 and L4, and lateral recess decompression. 3. Lysis of epidural venous plexus at L4-5 and microdiskectomy L4-5,     right.  ANESTHESIA:  General.  ASSISTANT:  Roma Schanz, P.A.  SPECIMEN:  L4-5 disk to Pathology.  HISTORY:  A 41 year old with refractory right lower extremity radicular pain, positive neural tension signs, EHL weakness, despite rest, activity modification, corticosteroid injections indicated for lumbar decompression due to the persistent symptoms.  Risk and benefits were discussed including bleeding, infection, damage to neurovascular structures, no change in symptoms, worsening symptoms, need for repeat debridement, DVT, PE, anesthetic complications etc.  TECHNIQUE:  With the patient in supine position, after induction of adequate general anesthesia, 2 g Kefzol, he was placed prone on the [place] frame.  All bony prominences were well-padded.  The lumbar region was prepped and draped in the usual sterile fashion.  Two 18- gauge spinal needles utilized to localize L4-5 interspace confirmed with x-ray.  Incision was made 2 cm in length over the L4-5 space. Subcutaneous tissue was dissected.  Electrocautery was utilized to achieve hemostasis.  The dorsolumbar fascia was identified, divided in line with skin incision.  Paraspinous muscle elevated from lamina at L4 and L5.  The operating microscope was draped, brought  into the surgical field, confirmatory radiograph obtained with Penfield 4 in the interlaminar space.  Next, a hemilaminotomy of the caudate edge of 4 was performed with a 2 and 3-mm Kerrison, detached ligamentum flavum from cephalad edge, preserving the pars.  A straight curette was utilized to detach the ligamentum flavum from the cephalad edge of 5.  Neuro patty placed beneath the ligamentum flavum.  Ligamentum flavum removed from the interspace.  L5 root was noted to be tethered and compressed into the lateral recess.  I gently mobilized it with a Penfield 4 medially with extensive epidural venous plexus tethering the root.  I lysed the epidural venous plexus with bipolar cautery.  We decompressed the lateral recess to the medial border of the pedicle.  With the neural elements well-protected, we performed a foraminotomy of L5 and identified the disk space, confirmed it with an x-ray with the Penfield 4 at the disk space.  There was focal HNP.  The foramen of L4 was hypertrophic ligamentum.  This was removed with the L4 root well- protected.  Again, there was significant epidural venous plexus and it tethered the root up to the shoulder and the axilla of the L4 root. After mobilizing the thecal sac medially, we performed annulotomy. Copious portion of disk material was removed from the disk space with a straight micropituitary nerve hook.  Full diskectomy of herniated material was performed and the  disk was sent to pathology for appropriate analysis.  I then irrigated the disk space copiously with antibiotic irrigation.  The hockey-stick probe passed freely up the foramen of L4 and L5 and into the pedicle of L4.  There was 1-cm excursion of the L5 root  leaving the pedicle without tension.  I then inspected the thecal sac and the roots.  There was no evidence of a dural leak.  We performed intraoperative Valsalva and there was no associated leak.  I then removed the [facility]  retractor.  Paraspinous muscles were inspected.  No evidence of active bleeding.  I repaired the dorsolumbar fascia with 1 Vicryl interrupted figure-of-8 suture, subcu with 2-0 Vicryl simple sutures.  Skin was reapproximated with 4-0 subcuticular Prolene.  The wound was reinforced with Steri-Strips, sterile dressing applied.  The patient was placed supine on the hospital bed, extubated without difficulty, and transported to the recovery room in satisfactory condition.  The ligamentum flavum that was removed from the interspace was first removed from caudad to cephalad with neuro patty placed beneath the ligamentum flavum.  The neural elements were protected with neuro patties at all times .  The patient tolerated the procedure well.  No complications.  Minimal blood loss.     Jene Every, M.D.     [name]  D:  10/16/2011  T:  10/17/2011  Job:  [id number]

## 2011-10-17 NOTE — Progress Notes (Signed)
D/c instructions given and explained to pt. Pt and family voiced understanding. Prescriptions for percocet and robaxin given to pt, placed in pt's hands.  Iv site d/c, pressure dressing applied to lt hand.  Dressing changed before d/c and instructed pt and pt's family on how to change dressing.  Family voiced understanding of how to change dressing. Gave dressing supplies also.  Pt alert and stable.  Pt d/c home with family at this time via car to home.

## 2011-10-17 NOTE — Progress Notes (Signed)
Subjective: 1 Day Post-Op Procedure(s) (LRB): LUMBAR LAMINECTOMY/DECOMPRESSION MICRODISCECTOMY (Right) Patient reports pain as mild.   Denies CP or SOB.  Voiding without difficulty. Positive flatus. Objective: Vital signs in last 24 hours: Temp:  [97 F (36.1 C)-98.6 F (37 C)] 97.9 F (36.6 C) (05/17 0533) Pulse Rate:  [69-92] 69  (05/17 0533) Resp:  [15-20] 16  (05/17 0533) BP: (101-144)/(50-80) 122/77 mmHg (05/17 0533) SpO2:  [95 %-100 %] 95 % (05/17 0533) Weight:  [102.604 kg (226 lb 3.2 oz)] 102.604 kg (226 lb 3.2 oz) (05/16 1351)  Intake/Output from previous day: 05/16 0701 - 05/17 0700 In: 4084.7 [P.O.:920; I.V.:3009.7; IV Piggyback:155] Out: 3900 [Urine:3900] Intake/Output this shift:    No results found for this basename: HGB:5 in the last 72 hours No results found for this basename: WBC:2,RBC:2,HCT:2,PLT:2 in the last 72 hours No results found for this basename: NA:2,K:2,CL:2,CO2:2,BUN:2,CREATININE:2,GLUCOSE:2,CALCIUM:2 in the last 72 hours No results found for this basename: LABPT:2,INR:2 in the last 72 hours  Neurologically intact Neurovascular intact Sensation intact distally Dorsiflexion/Plantar flexion intact Incision: dressing C/D/I Compartment soft  Assessment/Plan: 1 Day Post-Op Procedure(s) (LRB): LUMBAR LAMINECTOMY/DECOMPRESSION MICRODISCECTOMY (Right) Discharge home with home health  Whitleigh Garramone R. 10/17/2011, 9:33 AM

## 2011-10-17 NOTE — Progress Notes (Signed)
The patient is receiving Protonix by the intravenous route. Based on criteria approved by the Pharmacy and Therapeutics Committee and the Medical Executive Committee, the medication is being converted to the equivalent oral dose form.   These criteria include:  -No Active GI bleeding  -Able to tolerate diet of full liquids (or better) or tube feeding  -Able to tolerate other medications by the oral or enteral route   If you have any questions about this conversion, please contact the Pharmacy Department ([phone] Thank you.  Hanad Leino, PharmD, BCPS     

## 2011-10-17 NOTE — Evaluation (Signed)
Physical Therapy Evaluation Patient Details Name: Brett Morgan MRN: [id number] DOB: [birth year] Today's Date: 10/17/2011 Time: 6213-0865 PT Time Calculation (min): 21 min  PT Assessment / Plan / Recommendation Clinical Impression  Pt presents s/p lumbar laminectomy/decompression microdisectomy at L4/5 level.  Tolerated ambulation very well with RW and anticipate that pt will only need cane at home for short period of time.  States that he thinks his mother has one.  Performed flight of stairs without difficulty.  Pt needs no further follow up in acute venue or at home.  Pt ready for D/C today.     PT Assessment  Patent does not need any further PT services    Follow Up Recommendations  No PT follow up    Barriers to Discharge        lEquipment Recommendations  Cane (may need cane if pts mother doesn't have one)    Recommendations for Other Services     Frequency      Precautions / Restrictions Precautions Precautions: Back Precaution Booklet Issued: Yes (comment) Precaution Comments: Provided handout and educated Restrictions Weight Bearing Restrictions: No   Pertinent Vitals/Pain 4/10      Mobility  Bed Mobility Bed Mobility: Rolling Left;Left Sidelying to Sit Rolling Left: 5: Supervision Left Sidelying to Sit: 5: Supervision Details for Bed Mobility Assistance: Supervision for safety with min cues for log rolling.  Transfers Transfers: Sit to Stand;Stand to Sit Sit to Stand: 5: Supervision;With upper extremity assist;From bed Stand to Sit: 5: Supervision;With upper extremity assist;With armrests;To chair/3-in-1 Details for Transfer Assistance: Supervision for safety with cues for hand placement  Ambulation/Gait Ambulation/Gait Assistance: 5: Supervision Ambulation Distance (Feet): 400 Feet Assistive device: Rolling walker Ambulation/Gait Assistance Details: Min cues for RW placement  Gait Pattern: Within Functional Limits Gait velocity: WFL Stairs: Yes Stairs  Assistance: 4: Min guard Stairs Assistance Details (indicate cue type and reason): Cues for sequencing/technique.  Had pt demo stair training going sideways and forward with pt comfortable with either.  Stair Management Technique: One rail Left;Alternating pattern;Sideways;Step to pattern;Forwards Number of Stairs: 12  Wheelchair Mobility Wheelchair Mobility: No    Exercises     PT Diagnosis:    PT Problem List:   PT Treatment Interventions:     PT Goals    Visit Information  Last PT Received On: 10/17/11 Assistance Needed: +1    Subjective Data  Subjective: I'm trying to walk as much as I can  Patient Stated Goal: to return home.    Prior Functioning  Home Living Lives With: Family Available Help at Discharge: Family Home Access: Stairs to enter Secretary/administrator of Steps: 2 Entrance Stairs-Rails: None Home Layout: Multi-level Alternate Level Stairs-Number of Steps: 2 flights Alternate Level Stairs-Rails: Can reach both Bathroom Shower/Tub: Health visitor: Standard Home Adaptive Equipment: Grab bars around toilet;Bedside commode/3-in-1;Walker - rolling;Straight cane Additional Comments: Pt states that he may have cane, but will check with mother to make sure Prior Function Level of Independence: Independent Able to Take Stairs?: Yes Driving: Yes Vocation: Full time employment Communication Communication: No difficulties    Cognition  Overall Cognitive Status: Appears within functional limits for tasks assessed/performed Arousal/Alertness: Awake/alert Orientation Level: Appears intact for tasks assessed Behavior During Session: [facility] for tasks performed    Extremity/Trunk Assessment Right Lower Extremity Assessment RLE ROM/Strength/Tone: Within functional levels RLE Sensation: WFL - Light Touch RLE Coordination: WFL - gross motor Left Lower Extremity Assessment LLE ROM/Strength/Tone: Within functional levels LLE Coordination: WFL - gross  motor Trunk  Assessment Trunk Assessment: Normal   Balance    End of Session PT - End of Session Activity Tolerance: Patient tolerated treatment well Patient left: in chair;with call bell/phone within reach Nurse Communication: Mobility status   Page, Meribeth Mattes 10/17/2011, 9:07 AM

## 2011-10-17 NOTE — Evaluation (Signed)
Occupational Therapy Evaluation Patient Details Name: Brett Morgan MRN: [id number] DOB: [birth year] Today's Date: 10/17/2011 Time: 4098-1191 OT Time Calculation (min): 8 min  OT Assessment / Plan / Recommendation Clinical Impression  This 41 year old man was admitted for L45 microdisc.  He will stay at parents.  All education regarding ADLs and back precautions was done, and pt verbalizes understanding.  He is supervision level for adls (with long sponge and slip on shoes).  Education about toilet aid was provided, if he finds he needs this.  No further OT needs.    OT Assessment  Patient does not need any further OT services    Follow Up Recommendations       Barriers to Discharge      Equipment Recommendations  Cane; (may need cane if pts mother doesn't have one) ; no DME needs from OT   Recommendations for Other Services    Frequency       Precautions / Restrictions Precautions Precautions: Back Precaution Booklet Issued: Yes (comment) Precaution Comments: Provided handout and educated Restrictions Weight Bearing Restrictions: No   Pertinent Vitals/Pain 4/10    ADL  Toilet Transfer Equipment:  (educated that he can sit backwards if needed: has extension) Toileting - Clothing Manipulation and Hygiene: Other (comment) (hygiene appears WFLs--educated on toilet aid if needed) Transfers/Ambulation Related to ADLs: supervision level.   ADL Comments: Session focused on back education with ADLs.  Pt verbalizes all.  Pt was able to cross legs prior to surgery.  Educated that he can do this if no pain/pulling.  He plans to wear slide on shoes and he has long sponge.   Pt can partially cross leg for pants:  Has assist if needed.     OT Diagnosis:    OT Problem List:   OT Treatment Interventions:     OT Goals    Visit Information  Last OT Received On: 10/17/11 Assistance Needed: +1    Subjective Data  Subjective: My mother had 2 back surgeries...one was just like  this Patient Stated Goal: did not ask   Prior Functioning  Home Living Lives With: Family Available Help at Discharge: Family Home Access: Stairs to enter Secretary/administrator of Steps: 2 Entrance Stairs-Rails: None Home Layout: Multi-level Alternate Level Stairs-Number of Steps: 2 flights Alternate Level Stairs-Rails: Can reach both Bathroom Shower/Tub:  (seat present if needed) Bathroom Toilet:  (has 2 grab bars next to it.  also has elevated riser,) Home Adaptive Equipment: Grab bars around toilet;Bedside commode/3-in-1;Walker - rolling;Straight cane Additional Comments: has a long brush for bathing Prior Function Level of Independence: Independent Able to Take Stairs?: Yes Driving: Yes Vocation: Full time employment Communication Communication: No difficulties    Cognition  Overall Cognitive Status: Appears within functional limits for tasks assessed/performed Arousal/Alertness: Awake/alert Orientation Level: Appears intact for tasks assessed Behavior During Session: [name] for tasks performed    Extremity/Trunk Assessment Right Upper Extremity Assessment RUE ROM/Strength/Tone: WFL for tasks assessed (educated to keep at 90 degrees or below) Left Upper Extremity Assessment LUE ROM/Strength/Tone: WFL for tasks assessed Right Lower Extremity Assessment RLE ROM/Strength/Tone: Within functional levels RLE Sensation: WFL - Light Touch RLE Coordination: WFL - gross motor Left Lower Extremity Assessment LLE ROM/Strength/Tone: Within functional levels LLE Coordination: WFL - gross motor Trunk Assessment Trunk Assessment: Normal   Mobility    Exercise    Balance    End of Session OT - End of Session Patient left: in chair   Brett Morgan 10/17/2011, 9:22 AM [name]  [name], OTR/L [phone] 10/17/2011

## 2011-10-20 ENCOUNTER — Encounter (HOSPITAL_COMMUNITY): Payer: Self-pay | Admitting: Specialist

## 2011-10-20 NOTE — Discharge Summary (Signed)
Patient ID: Brett Morgan MRN: [id number] DOB/AGE: [birth year] 41 y.o.  Admit date: 10/16/2011 Discharge date: 10/20/2011  Admission Diagnoses:  HNP Past Medical History  Diagnosis Date  . ASTHMA 04/18/2009    Qualifier: Diagnosis of  By: Brett Rung LPN, Brett Morgan    . Personal history of urinary calculi 04/18/2009    Qualifier: Diagnosis of  By: Brett Rung LPN, Brett Morgan    . HYPERLIPIDEMIA 06/06/2010    Qualifier: Diagnosis of  By: Brett Never Brett Morgan, Brett Morgan    . 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 male who was admitted 10/16/2011 for operative treatment of HNP. Patient has severe unremitting pain that affects sleep, daily activities, and work/hobbies. After pre-op clearance the patient was taken to the operating room on 10/16/2011 and underwent  Procedure(s): LUMBAR LAMINECTOMY/DECOMPRESSION MICRODISCECTOMY.    Patient was given perioperative antibiotics:  Anti-infectives     Start     Dose/Rate Route Frequency Ordered Stop   10/16/11 1600   ceFAZolin (ANCEF) IVPB 2 g/50 mL premix        2 g 100 mL/hr over 30 Minutes Intravenous Every 8 hours 10/16/11 1342 10/17/11 0030   10/16/11 0842   polymyxin B 500,000 Units, bacitracin 50,000 Units in sodium chloride irrigation 0.9 % 500 mL irrigation  Status:  Discontinued          As needed 10/16/11 0842 10/16/11 0911   10/16/11 0530   ceFAZolin (ANCEF) IVPB 2 g/50 mL premix        2 g 100 mL/hr over 30 Minutes Intravenous 60 min pre-op 10/16/11 0530 10/16/11 0740           Patient was given sequential compression devices, early ambulation, and chemoprophylaxis to prevent DVT.  Patient benefited maximally from hospital stay and there were no complications.    Recent vital signs:  Vital signs in last 24 hours:  Temp: [97 F (36.1  C)-98.6 F (37 C)] 97.9 F (36.6 C) (05/17 0533)  Pulse Rate: [69-92] 69 (05/17 0533)  Resp: [15-20] 16 (05/17 0533)  BP: (101-144)/(50-80) 122/77 mmHg (05/17 0533)  SpO2: [95 %-100 %] 95 % (05/17 0533)  Weight: [102.604 kg (226 lb 3.2 oz)] 102.604 kg (226 lb 3.2 oz) (05/16 1351)   Discharge Medications:   Medication List  As of 10/20/2011  8:28 AM   TAKE these medications         acetaminophen 500 MG tablet   Commonly known as: TYLENOL   Take 1,000 mg by mouth every 6 (six) hours as needed.      aspirin EC 81 MG tablet   Take 81 mg by mouth 2 (two) times daily.      buPROPion 150 MG 12 hr tablet   Commonly known as: ZYBAN   Take 150 mg by mouth 2 (two) times daily.      CHOLEST OFF COMPLETE 450-75 MG Tabs   Generic drug: Plant Sterol Stanol-Pantethine   Take 1 tablet by mouth 2 (two) times daily.      fish oil-omega-3 fatty acids 1000 MG capsule   Take 1-2 g by mouth 2 (two) times daily.      Magnesium 250 MG Tabs   Take 1 tablet by mouth daily.      methocarbamol 500 MG tablet   Commonly known as: ROBAXIN  Take 1 tablet (500 mg total) by mouth every 6 (six) hours as needed. For muscle spasm      mulitivitamin with minerals Tabs   Take 1 tablet by mouth daily.      nicotine 14 mg/24hr patch   Commonly known as: NICODERM CQ - dosed in mg/24 hours   Place 1 patch onto the skin daily.      oxyCODONE-acetaminophen 5-325 MG per tablet   Commonly known as: PERCOCET   Take 2 tablets by mouth every 4 (four) hours as needed. For pain      Potassium 99 MG Tabs   Take 1 tablet by mouth daily.      PROVENTIL HFA 108 (90 BASE) MCG/ACT inhaler   Generic drug: albuterol   Inhale 2 puffs into the lungs every 4 (four) hours as needed. For wheezing              Diagnostic Studies: Dg Lumbar Spine 2-3 Views  10/13/2011  *RADIOLOGY REPORT*  Clinical Data: Preop back surgery.  Chronic back pain  LUMBAR SPINE - 2-3 VIEW  Comparison: None.  Findings: Five non-rib bearing  lumbar vertebra are present.  The lowest disc space is assigned L5-S1.  Mild disc space narrowing L4-5 and L5-S1.  No significant spondylosis.  Negative for fracture or pars defect.  No focal bony lesion.  SI joints are intact.  IMPRESSION: Mild disc space narrowing L4-5 and L5-S1.  Original Report Authenticated By: Brett Morgan, M.D.   Dg Spine Portable 1 View  10/16/2011  *RADIOLOGY REPORT*  Clinical Data: Intraoperative localization.  PORTABLE SPINE - 1 VIEW  Comparison: 10/16/2011  Findings: Posterior surgical instruments at the L4-5 level.  IMPRESSION: Intraoperative localization L4-5.  Original Report Authenticated By: Brett Morgan, M.D.   Dg Spine Portable 1 View  10/16/2011  *RADIOLOGY REPORT*  Clinical Data: Intraoperative localization for lumbar surgery.  PORTABLE SPINE - 1 VIEW  Comparison: Earlier film, same date.  Findings: A surgical instrument is marking the L5 vertebral body level.  IMPRESSION:  L5 level marked intraoperatively.  Original Report Authenticated By: Brett Morgan, M.D.   Dg Spine Portable 1 View  10/16/2011  *RADIOLOGY REPORT*  Clinical Data:  intraoperative position.  PORTABLE SPINE - 1 VIEW  Comparison: 10/13/2011  Findings: Posterior needles are directed the upper sacrum and the L5 level.  IMPRESSION: Intraoperative localization as above.  Original Report Authenticated By: Brett Morgan, M.D.    Disposition: 01-Home or Self Care  Discharge Orders    Future Orders Please Complete By Expires   Diet - low sodium heart healthy      Call Brett Morgan / Call 911      Comments:   If you experience chest pain or shortness of breath, CALL 911 and be transported to the hospital emergency room.  If you develope a fever above 101 F, pus (white drainage) or increased drainage or redness at the wound, or calf pain, call your surgeon's office.   Constipation Prevention      Comments:   Drink plenty of fluids.  Prune juice may be helpful.  You may use a stool softener, such as  Colace (over the counter) 100 mg twice a day.  Use MiraLax (over the counter) for constipation as needed.   Increase activity slowly as tolerated      Weight Bearing as taught in Physical Therapy      Comments:   Use a walker or crutches as instructed.   Discharge instructions  Comments:   Walk As Tolerated utilizing back precautions.  No bending, twisting, or lifting.  No driving for 2 weeks.  Ok to shower in 72 hours.  Use good body mechanics. Change dressing daily.      Follow-up Information    Follow up with BEANE,JEFFREY C, Brett Morgan in 14 days.   Contact information:   [facility] [address], [address] [place] [address] [phone]           Signed: Liam Morgan. 10/20/2011, 8:28 AM

## 2011-12-25 ENCOUNTER — Other Ambulatory Visit: Payer: Self-pay | Admitting: Family Medicine

## 2012-03-14 ENCOUNTER — Other Ambulatory Visit: Payer: Self-pay | Admitting: Family Medicine

## 2012-04-13 ENCOUNTER — Ambulatory Visit (INDEPENDENT_AMBULATORY_CARE_PROVIDER_SITE_OTHER): Payer: BC Managed Care – PPO | Admitting: Family Medicine

## 2012-04-13 DIAGNOSIS — Z23 Encounter for immunization: Secondary | ICD-10-CM

## 2012-06-23 ENCOUNTER — Other Ambulatory Visit (INDEPENDENT_AMBULATORY_CARE_PROVIDER_SITE_OTHER): Payer: BC Managed Care – PPO

## 2012-06-23 DIAGNOSIS — Z0000 Encounter for general adult medical examination without abnormal findings: Secondary | ICD-10-CM

## 2012-06-23 LAB — POCT URINALYSIS DIPSTICK
Ketones, UA: NEGATIVE
Leukocytes, UA: NEGATIVE
Nitrite, UA: NEGATIVE
Urobilinogen, UA: 0.2
pH, UA: 7

## 2012-06-23 LAB — CBC WITH DIFFERENTIAL/PLATELET
Basophils Relative: 0.4 % (ref 0.0–3.0)
Eosinophils Relative: 1.4 % (ref 0.0–5.0)
HCT: 39.8 % (ref 39.0–52.0)
Lymphs Abs: 2.6 10*3/uL (ref 0.7–4.0)
MCV: 94.1 fl (ref 78.0–100.0)
Monocytes Absolute: 0.4 10*3/uL (ref 0.1–1.0)
Monocytes Relative: 5.9 % (ref 3.0–12.0)
Neutrophils Relative %: 56.3 % (ref 43.0–77.0)
RBC: 4.23 Mil/uL (ref 4.22–5.81)
WBC: 7.3 10*3/uL (ref 4.5–10.5)

## 2012-06-23 LAB — BASIC METABOLIC PANEL
BUN: 12 mg/dL (ref 6–23)
CO2: 27 mEq/L (ref 19–32)
GFR: 94.26 mL/min (ref >60.00)
Glucose, Bld: 96 mg/dL (ref 70–99)
Potassium: 4.4 mEq/L (ref 3.5–5.1)
Sodium: 139 mEq/L (ref 135–145)

## 2012-06-23 LAB — LIPID PANEL
HDL: 34.7 mg/dL — ABNORMAL LOW (ref >39.00)
Total CHOL/HDL Ratio: 5
VLDL: 16.2 mg/dL (ref 0.0–40.0)

## 2012-06-23 LAB — HEPATIC FUNCTION PANEL
Albumin: 4 g/dL (ref 3.5–5.2)
Total Protein: 6.9 g/dL (ref 6.0–8.3)

## 2012-06-23 LAB — TSH: TSH: 1 u[IU]/mL (ref 0.35–5.50)

## 2012-06-30 ENCOUNTER — Encounter: Payer: BC Managed Care – PPO | Admitting: Family Medicine

## 2012-07-05 ENCOUNTER — Other Ambulatory Visit: Payer: Self-pay | Admitting: Family Medicine

## 2012-07-20 ENCOUNTER — Encounter: Payer: BC Managed Care – PPO | Admitting: Family Medicine

## 2012-07-22 ENCOUNTER — Ambulatory Visit (INDEPENDENT_AMBULATORY_CARE_PROVIDER_SITE_OTHER): Payer: BC Managed Care – PPO | Admitting: Family Medicine

## 2012-07-22 VITALS — BP 120/60 | HR 72 | Temp 98.3°F | Resp 12 | Ht 71.0 in | Wt 240.0 lb

## 2012-07-22 DIAGNOSIS — Z0000 Encounter for general adult medical examination without abnormal findings: Secondary | ICD-10-CM

## 2012-07-22 MED ORDER — VARENICLINE TARTRATE 0.5 MG X 11 & 1 MG X 42 PO MISC: ORAL | Status: DC

## 2012-07-22 MED ORDER — BUPROPION HCL ER (SMOKING DET) 150 MG PO TB12: 150.0000 mg | ORAL_TABLET | Freq: Two times a day (BID) | ORAL | Status: DC

## 2012-07-22 MED ORDER — ALBUTEROL SULFATE HFA 108 (90 BASE) MCG/ACT IN AERS: 2.0000 | INHALATION_SPRAY | RESPIRATORY_TRACT | Status: DC | PRN

## 2012-07-22 MED ORDER — VARENICLINE TARTRATE 1 MG PO TABS: 1.0000 mg | ORAL_TABLET | Freq: Two times a day (BID) | ORAL | Status: DC

## 2012-07-22 NOTE — Progress Notes (Signed)
Subjective:    Patient ID: Brett Morgan, male    DOB: 1970/12/24, 42 y.o.   MRN: [id number]  HPI  Patient seen for wellness visit. He had lumbosacral disc surgery back in May. Apparently had L5-S1 discectomy. Done well since then. Has been exercising about 4 days per week. He currently smokes about half-pack cigarettes per day. He desires to quit. His tried Zyban in the past without much success and prefers to try Chantix. He has mild intermittent asthma and rarely takes Proventil and requesting refills. Tetanus is up-to-date.  Past Medical History  Diagnosis Date  . ASTHMA 04/18/2009    Qualifier: Diagnosis of  By: Gabriel Rung LPN, Harriett Sine    . Personal history of urinary calculi 04/18/2009    Qualifier: Diagnosis of  By: Gabriel Rung LPN, Harriett Sine    . HYPERLIPIDEMIA 06/06/2010    Qualifier: Diagnosis of  By: Caryl Never MD, Jerl Munyan    . 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 Tonsillectomy    . Tonsillectomy    . Knee surgery      arthroscopic left  . Lumbar laminectomy/decompression microdiscectomy  10/16/2011    Procedure: LUMBAR LAMINECTOMY/DECOMPRESSION MICRODISCECTOMY;  Surgeon: Javier Docker, MD;  Location: [facility];  Service: Orthopedics;  Laterality: Right;  Micro Lumbar Decompression L4-L5 on Right    reports that he has been smoking Cigarettes and Cigars.  He has a 25 pack-year smoking history. He has quit using smokeless tobacco. He reports that  drinks alcohol. He reports that he does not use illicit drugs. family history includes Arthritis in his other; Cancer in his other; Heart disease (age of onset: 22) in his father; Hypertension in his other; and Stroke in his other. No Known Allergies    Review of Systems  Constitutional: Negative for fever, activity change, appetite change and fatigue.  HENT: Negative for ear pain, congestion and trouble swallowing.   Eyes: Negative for pain and visual disturbance.  Respiratory: Negative for  cough, shortness of breath and wheezing.   Cardiovascular: Negative for chest pain and palpitations.  Gastrointestinal: Negative for nausea, vomiting, abdominal pain, diarrhea, constipation, blood in stool, abdominal distention and rectal pain.  Genitourinary: Negative for dysuria, hematuria and testicular pain.  Musculoskeletal: Negative for joint swelling and arthralgias.  Skin: Negative for rash.  Neurological: Negative for dizziness, syncope and headaches.  Hematological: Negative for adenopathy.  Psychiatric/Behavioral: Negative for confusion and dysphoric mood.       Objective:   Physical Exam  Constitutional: He is oriented to person, place, and time. He appears well-developed and well-nourished. No distress.  HENT:  Head: Normocephalic and atraumatic.  Right Ear: External ear normal.  Left Ear: External ear normal.  Mouth/Throat: Oropharynx is clear and moist.  Eyes: Conjunctivae and EOM are normal. Pupils are equal, round, and reactive to light.  Neck: Normal range of motion. Neck supple. No thyromegaly present.  Cardiovascular: Normal rate, regular rhythm and normal heart sounds.   No murmur heard. Pulmonary/Chest: No respiratory distress. He has no wheezes. He has no rales.  Abdominal: Soft. Bowel sounds are normal. He exhibits no distension and no mass. There is no tenderness. There is no rebound and no guarding.  Musculoskeletal: He exhibits no edema.  Lymphadenopathy:    He has no cervical adenopathy.  Neurological: He is alert and oriented to person, place, and time. He displays normal reflexes. No cranial nerve deficit.  Skin: No rash noted.  Psychiatric:  He has a normal mood and affect.          Assessment & Plan:  Health maintenance. Labs reviewed. 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 Otherwise labs okay. Refill Proventil for as needed use. Smoking cessation discussed. Trial of Chantix.

## 2012-12-25 ENCOUNTER — Other Ambulatory Visit: Payer: Self-pay | Admitting: Family Medicine

## 2013-02-16 ENCOUNTER — Ambulatory Visit (HOSPITAL_COMMUNITY)
Admission: RE | Admit: 2013-02-16 | Discharge: 2013-02-16 | Disposition: A | Discharge status: Home / Self Care | Payer: BC Managed Care – PPO | Source: Ambulatory Visit | Attending: Physician Assistant | Admitting: Physician Assistant

## 2013-02-16 ENCOUNTER — Encounter (HOSPITAL_COMMUNITY)
Admission: EM | Disposition: A | Discharge status: Home / Self Care | Payer: Self-pay | Source: Home / Self Care | Attending: Emergency Medicine

## 2013-02-16 ENCOUNTER — Ambulatory Visit (INDEPENDENT_AMBULATORY_CARE_PROVIDER_SITE_OTHER): Payer: BC Managed Care – PPO | Admitting: Family Medicine

## 2013-02-16 ENCOUNTER — Observation Stay (HOSPITAL_COMMUNITY)
Admission: EM | Admit: 2013-02-16 | Discharge: 2013-02-17 | Disposition: A | Discharge status: Home / Self Care | Payer: BC Managed Care – PPO | Attending: General Surgery | Admitting: General Surgery

## 2013-02-16 ENCOUNTER — Encounter (HOSPITAL_COMMUNITY): Payer: Self-pay | Admitting: Emergency Medicine

## 2013-02-16 VITALS — BP 118/72 | HR 72 | Temp 99.5°F | Resp 18 | Ht 71.0 in | Wt 229.4 lb

## 2013-02-16 DIAGNOSIS — I7090 Unspecified atherosclerosis: Secondary | ICD-10-CM | POA: Insufficient documentation

## 2013-02-16 DIAGNOSIS — R509 Fever, unspecified: Secondary | ICD-10-CM

## 2013-02-16 DIAGNOSIS — R1031 Right lower quadrant pain: Secondary | ICD-10-CM

## 2013-02-16 DIAGNOSIS — Z9049 Acquired absence of other specified parts of digestive tract: Secondary | ICD-10-CM

## 2013-02-16 DIAGNOSIS — K389 Disease of appendix, unspecified: Secondary | ICD-10-CM | POA: Insufficient documentation

## 2013-02-16 DIAGNOSIS — N289 Disorder of kidney and ureter, unspecified: Secondary | ICD-10-CM | POA: Insufficient documentation

## 2013-02-16 DIAGNOSIS — K3580 Unspecified acute appendicitis: Principal | ICD-10-CM | POA: Insufficient documentation

## 2013-02-16 DIAGNOSIS — Z79899 Other long term (current) drug therapy: Secondary | ICD-10-CM | POA: Insufficient documentation

## 2013-02-16 DIAGNOSIS — R110 Nausea: Secondary | ICD-10-CM

## 2013-02-16 DIAGNOSIS — J45909 Unspecified asthma, uncomplicated: Secondary | ICD-10-CM | POA: Insufficient documentation

## 2013-02-16 DIAGNOSIS — Z87442 Personal history of urinary calculi: Secondary | ICD-10-CM | POA: Insufficient documentation

## 2013-02-16 DIAGNOSIS — E785 Hyperlipidemia, unspecified: Secondary | ICD-10-CM | POA: Insufficient documentation

## 2013-02-16 DIAGNOSIS — Z7982 Long term (current) use of aspirin: Secondary | ICD-10-CM | POA: Insufficient documentation

## 2013-02-16 DIAGNOSIS — Z23 Encounter for immunization: Secondary | ICD-10-CM | POA: Insufficient documentation

## 2013-02-16 HISTORY — PX: LAPAROSCOPIC APPENDECTOMY: SHX408

## 2013-02-16 LAB — COMPREHENSIVE METABOLIC PANEL WITH GFR
ALT: 10 U/L (ref 0–53)
AST: 16 U/L (ref 0–37)
Albumin: 3.8 g/dL (ref 3.5–5.2)
Alkaline Phosphatase: 80 U/L (ref 39–117)
BUN: 11 mg/dL (ref 6–23)
CO2: 24 meq/L (ref 19–32)
Calcium: 9.5 mg/dL (ref 8.4–10.5)
Chloride: 100 meq/L (ref 96–112)
Creatinine, Ser: 0.76 mg/dL (ref 0.50–1.35)
GFR calc Af Amer: >90 mL/min
GFR calc non Af Amer: >90 mL/min
Glucose, Bld: 98 mg/dL (ref 70–99)
Potassium: 4 meq/L (ref 3.5–5.1)
Sodium: 137 meq/L (ref 135–145)
Total Bilirubin: 0.2 mg/dL — ABNORMAL LOW (ref 0.3–1.2)
Total Protein: 7.1 g/dL (ref 6.0–8.3)

## 2013-02-16 LAB — CBC WITH DIFFERENTIAL/PLATELET
Basophils Absolute: 0 K/uL (ref 0.0–0.1)
Basophils Relative: 0 % (ref 0–1)
Eosinophils Absolute: 0.2 K/uL (ref 0.0–0.7)
Eosinophils Relative: 3 % (ref 0–5)
HCT: 38.1 % — ABNORMAL LOW (ref 39.0–52.0)
Hemoglobin: 13.5 g/dL (ref 13.0–17.0)
Lymphocytes Relative: 48 % — ABNORMAL HIGH (ref 12–46)
Lymphs Abs: 4.3 K/uL — ABNORMAL HIGH (ref 0.7–4.0)
MCH: 31.9 pg (ref 26.0–34.0)
MCHC: 35.4 g/dL (ref 30.0–36.0)
MCV: 90.1 fL (ref 78.0–100.0)
Monocytes Absolute: 0.7 K/uL (ref 0.1–1.0)
Monocytes Relative: 8 % (ref 3–12)
Neutro Abs: 3.7 K/uL (ref 1.7–7.7)
Neutrophils Relative %: 42 % — ABNORMAL LOW (ref 43–77)
Platelets: 174 K/uL (ref 150–400)
RBC: 4.23 MIL/uL (ref 4.22–5.81)
RDW: 12.8 % (ref 11.5–15.5)
WBC: 9 K/uL (ref 4.0–10.5)

## 2013-02-16 LAB — URINALYSIS, ROUTINE W REFLEX MICROSCOPIC
Bilirubin Urine: NEGATIVE
Glucose, UA: NEGATIVE mg/dL
Hgb urine dipstick: NEGATIVE
Ketones, ur: NEGATIVE mg/dL
Leukocytes, UA: NEGATIVE
Nitrite: NEGATIVE
Protein, ur: NEGATIVE mg/dL
Specific Gravity, Urine: >1.046 — ABNORMAL HIGH (ref 1.005–1.030)
Urobilinogen, UA: 0.2 mg/dL (ref 0.0–1.0)
pH: 5 (ref 5.0–8.0)

## 2013-02-16 LAB — POCT CBC
Granulocyte percent: 51.4 %G (ref 37–80)
Hemoglobin: 12.6 g/dL — AB (ref 14.1–18.1)
Lymph, poc: 2.8 (ref 0.6–3.4)
MID (cbc): 0.5 (ref 0–0.9)
MPV: 10.7 fL (ref 0–99.8)
POC Granulocyte: 3.5 (ref 2–6.9)
POC MID %: 7.2 %M (ref 0–12)
Platelet Count, POC: 174 10*3/uL (ref 142–424)
RBC: 4.07 M/uL — AB (ref 4.69–6.13)

## 2013-02-16 LAB — LIPASE, BLOOD: Lipase: 40 U/L (ref 11–59)

## 2013-02-16 SURGERY — APPENDECTOMY, LAPAROSCOPIC
Anesthesia: General | Site: Abdomen | Wound class: Clean Contaminated

## 2013-02-16 MED ORDER — SODIUM CHLORIDE 0.9 % IV BOLUS (SEPSIS)
1000.0000 mL | Freq: Once | INTRAVENOUS | Status: AC
  Administered 2013-02-16: 1000 mL via INTRAVENOUS

## 2013-02-16 MED ORDER — IOHEXOL 300 MG/ML  SOLN
50.0000 mL | Freq: Once | INTRAMUSCULAR | Status: AC | PRN
  Administered 2013-02-16: 50 mL via ORAL

## 2013-02-16 MED ORDER — IOHEXOL 300 MG/ML  SOLN
100.0000 mL | Freq: Once | INTRAMUSCULAR | Status: AC | PRN
  Administered 2013-02-16: 100 mL via INTRAVENOUS

## 2013-02-16 SURGICAL SUPPLY — 45 items
ADH SKN CLS APL DERMABOND .7 (GAUZE/BANDAGES/DRESSINGS)
APPLIER CLIP 5 13 M/L LIGAMAX5 (MISCELLANEOUS)
APPLIER CLIP ROT 10 11.4 M/L (STAPLE)
APR CLP MED LRG 11.4X10 (STAPLE)
APR CLP MED LRG 5 ANG JAW (MISCELLANEOUS)
BAG SPEC RTRVL LRG 6X4 10 (ENDOMECHANICALS) ×1
CABLE HIGH FREQUENCY MONO STRZ (ELECTRODE) ×1 IMPLANT
CANISTER SUCTION 2500CC (MISCELLANEOUS) ×2 IMPLANT
CHLORAPREP W/TINT 26ML (MISCELLANEOUS) ×2 IMPLANT
CLIP APPLIE 5 13 M/L LIGAMAX5 (MISCELLANEOUS) IMPLANT
CLIP APPLIE ROT 10 11.4 M/L (STAPLE) IMPLANT
CLOTH BEACON ORANGE TIMEOUT ST (SAFETY) ×2 IMPLANT
CUTTER FLEX LINEAR 45M (STAPLE) ×1 IMPLANT
DECANTER SPIKE VIAL GLASS SM (MISCELLANEOUS) ×2 IMPLANT
DERMABOND ADVANCED (GAUZE/BANDAGES/DRESSINGS)
DERMABOND ADVANCED .7 DNX12 (GAUZE/BANDAGES/DRESSINGS) IMPLANT
DRAPE LAPAROSCOPIC ABDOMINAL (DRAPES) ×2 IMPLANT
DRAPE UTILITY XL STRL (DRAPES) ×2 IMPLANT
ELECT REM PT RETURN 9FT ADLT (ELECTROSURGICAL) ×2
ELECTRODE REM PT RTRN 9FT ADLT (ELECTROSURGICAL) ×1 IMPLANT
GLOVE BIO SURGEON STRL SZ 6.5 (GLOVE) ×2 IMPLANT
GLOVE BIOGEL PI IND STRL 7.0 (GLOVE) ×1 IMPLANT
GLOVE BIOGEL PI INDICATOR 7.0 (GLOVE) ×1
GOWN STRL REIN 2XL XLG LVL4 (GOWN DISPOSABLE) ×2 IMPLANT
GOWN STRL REIN XL XLG (GOWN DISPOSABLE) ×2 IMPLANT
IV LACTATED RINGERS 1000ML (IV SOLUTION) ×2 IMPLANT
KIT BASIN OR (CUSTOM PROCEDURE TRAY) ×2 IMPLANT
POUCH SPECIMEN RETRIEVAL 10MM (ENDOMECHANICALS) ×2 IMPLANT
RELOAD 45 VASCULAR/THIN (ENDOMECHANICALS) ×2 IMPLANT
RELOAD STAPLE 45 2.5 WHT GRN (ENDOMECHANICALS) IMPLANT
RELOAD STAPLE TA45 3.5 REG BLU (ENDOMECHANICALS) ×2 IMPLANT
SCALPEL HARMONIC ACE (MISCELLANEOUS) ×2 IMPLANT
SET IRRIG TUBING LAPAROSCOPIC (IRRIGATION / IRRIGATOR) ×2 IMPLANT
SOLUTION ANTI FOG 6CC (MISCELLANEOUS) ×2 IMPLANT
SUT MNCRL AB 4-0 PS2 18 (SUTURE) ×2 IMPLANT
SUT VIC AB 4-0 PS2 27 (SUTURE) ×1 IMPLANT
SUT VIC AB 4-0 SH 27 (SUTURE) ×2
SUT VIC AB 4-0 SH 27XBRD (SUTURE) IMPLANT
TOWEL OR 17X26 10 PK STRL BLUE (TOWEL DISPOSABLE) ×2 IMPLANT
TRAY FOLEY CATH 14FRSI W/METER (CATHETERS) ×2 IMPLANT
TRAY LAP CHOLE (CUSTOM PROCEDURE TRAY) ×2 IMPLANT
TROCAR BLADELESS OPT 5 75 (ENDOMECHANICALS) ×4 IMPLANT
TROCAR XCEL 12X100 BLDLESS (ENDOMECHANICALS) IMPLANT
TROCAR XCEL BLUNT TIP 100MML (ENDOMECHANICALS) ×2 IMPLANT
TUBING INSUFFLATION 10FT LAP (TUBING) ×2 IMPLANT

## 2013-02-16 NOTE — ED Provider Notes (Signed)
CSN: [id number]     Arrival date & time 02/16/13  2230 History   First MD Initiated Contact with Patient 02/16/13 2235     Chief Complaint  Patient presents with  . Abdominal Pain   (Consider location/radiation/quality/duration/timing/severity/associated sxs/prior Treatment) HPI Comments: Patient presents with abdominal pain onset yesterday around 11:30 AM. It started as a epigastric pain since moves his right lower quadrant. It is associated with nausea and anorexia. No vomiting. No fever. No urinary symptoms. He was seen in urgent care and had outpatient she can which showed early appendicitis. No testicular pain. No back pain, chest pain or shortness of breath.  The history is provided by the patient.    Past Medical History  Diagnosis Date  . ASTHMA 04/18/2009    Qualifier: Diagnosis of  By: Gabriel Rung LPN, Harriett Sine    . Personal history of urinary calculi 04/18/2009    Qualifier: Diagnosis of  By: Gabriel Rung LPN, Harriett Sine    . HYPERLIPIDEMIA 06/06/2010    Qualifier: Diagnosis of  By: Caryl Never MD, [name]    . 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 Tonsillectomy    . Tonsillectomy    . Knee surgery      arthroscopic left  . Lumbar laminectomy/decompression microdiscectomy  10/16/2011    Procedure: LUMBAR LAMINECTOMY/DECOMPRESSION MICRODISCECTOMY;  Surgeon: Javier Docker, MD;  Location: [facility];  Service: Orthopedics;  Laterality: Right;  Micro Lumbar Decompression L4-L5 on Right   Family History  Problem Relation Age of Onset  . Heart disease Father 50    CAD  . Hypertension Other   . Cancer Other     grandparent, lung  . Stroke Other     grandparent  . Arthritis Mother    History  Substance Use Topics  . Smoking status: Former Smoker -- 1.00 packs/day for 25 years    Types: Cigarettes, Cigars    Quit date: 08/04/2012  . Smokeless tobacco: Former Neurosurgeon     Comment: using patch and zyban -trying to quit  . Alcohol Use: Yes      Comment: rarely    Review of Systems  Constitutional: Positive for activity change and appetite change. Negative for fever.  HENT: Negative for congestion and rhinorrhea.   Respiratory: Negative for cough, chest tightness and shortness of breath.   Cardiovascular: Negative for chest pain.  Gastrointestinal: Positive for nausea and abdominal pain. Negative for vomiting, diarrhea and constipation.  Genitourinary: Negative for dysuria, hematuria and testicular pain.  Musculoskeletal: Negative for back pain.  Skin: Negative for wound.  Neurological: Negative for dizziness, weakness and headaches.  A complete 10 system review of systems was obtained and all systems are negative except as noted in the HPI and PMH.    Allergies  Review of patient's allergies indicates no known allergies.  Home Medications   Current Outpatient Rx  Name  Route  Sig  Dispense  Refill  . albuterol (PROVENTIL HFA;VENTOLIN HFA) 108 (90 BASE) MCG/ACT inhaler   Inhalation   Inhale 2 puffs into the lungs every 6 (six) hours as needed for wheezing.         [name] Kitchen aspirin EC 81 MG tablet   Oral   Take 81 mg by mouth 2 (two) times daily.         . Multiple Vitamin (MULITIVITAMIN WITH MINERALS) TABS   Oral   Take 1 tablet by mouth daily.         [name] Kitchen  Plant Sterol Stanol-Pantethine (CHOLEST OFF COMPLETE) 450-75 MG TABS   Oral   Take 1 tablet by mouth 2 (two) times daily.          BP 149/73  Pulse 55  Temp(Src) 98.9 F (37.2 C) (Oral)  Resp 16  SpO2 97% Physical Exam  Constitutional: He is oriented to person, place, and time. He appears well-developed and well-nourished. No distress.  HENT:  Head: Normocephalic and atraumatic.  Mouth/Throat: Oropharynx is clear and moist. No oropharyngeal exudate.  Eyes: Conjunctivae and EOM are normal. Pupils are equal, round, and reactive to light.  Neck: Normal range of motion. Neck supple.  Cardiovascular: Normal rate, regular rhythm and normal heart sounds.   No  murmur heard. Pulmonary/Chest: Effort normal and breath sounds normal. No respiratory distress.  Abdominal: Soft. There is tenderness. There is rebound and guarding.  Right lower quadrant tenderness with guarding and rebound.  Musculoskeletal: Normal range of motion. He exhibits no edema and no tenderness.  Neurological: He is alert and oriented to person, place, and time. No cranial nerve deficit. He exhibits normal muscle tone. Coordination normal.  Skin: Skin is warm.    ED Course  Procedures (including critical care time) Labs Review Labs Reviewed  CBC WITH DIFFERENTIAL - Abnormal; Notable for the following:    HCT 38.1 (*)    Neutrophils Relative % 42 (*)    Lymphocytes Relative 48 (*)    Lymphs Abs 4.3 (*)    All other components within normal limits  COMPREHENSIVE METABOLIC PANEL - Abnormal; Notable for the following:    Total Bilirubin 0.2 (*)    All other components within normal limits  URINALYSIS, ROUTINE W REFLEX MICROSCOPIC - Abnormal; Notable for the following:    Specific Gravity, Urine >1.046 (*)    All other components within normal limits  LIPASE, BLOOD   Imaging Review Ct Abdomen Pelvis W Contrast  02/16/2013   CLINICAL DATA:  Right lower quadrant abdominal pain. Fever.  EXAM: CT ABDOMEN AND PELVIS WITH CONTRAST  TECHNIQUE: Multidetector CT imaging of the abdomen and pelvis was performed using the standard protocol following bolus administration of intravenous contrast.  CONTRAST:  50mL OMNIPAQUE IOHEXOL 300 MG/ML SOLN, OMNIPAQUE IOHEXOL 300 MG/ML SOLN  COMPARISON:  No priors.  FINDINGS: Lung Bases: Unremarkable.  Abdomen/Pelvis: The appearance of the liver, gallbladder, pancreas, spleen, bilateral adrenal glands and the right kidney is unremarkable. In the upper pole of the left kidney there is a 1.3 cm low-attenuation nonenhancing lesion compatible with a small simple cyst. Atherosclerosis throughout the abdominal and pelvic vasculature, without evidence of  aneurysm or dissection at this time. No significant volume of ascites. No pneumoperitoneum. No pathologic distention of small bowel. No definite pathologic lymphadenopathy. The appendix appears mildly dilated measuring up to 10 mm (image 51 of series 4). No overt inflammatory changes are identified adjacent to the appendix at this time. Prostate gland and urinary bladder are unremarkable in appearance.  Musculoskeletal: There are no aggressive appearing lytic or blastic lesions noted in the visualized portions of the skeleton.  IMPRESSION: 1. Mild dilatation of the appendix which measures up to 10 mm. No overt surrounding inflammatory changes are noted at this time, however, clinical correlation for signs and symptoms of early acute appendicitis is strongly recommended. Normal appendix measurements are typically less than 6 mm in normal adult patients, and the vast majority of the time are less than 8 mm. These results were called by telephone at the time of interpretation on 02/16/2013  at 10:28 PMto Dr. Manus Gunning in the Emergency Department a, who verbally acknowledged these results.   Electronically Signed   By: Trudie Reed M.D.   On: 02/16/2013 22:28    MDM   1. Acute appendicitis    2 day history of abdominal pain with nausea. Tenderness in the right lower quadrant with CT scan showing dilated appendix.  No leukocytosis. Patient's exam concerning for appendicitis. Discussed with Dr. Maisie Fus who will admit him for appendectomy.   Glynn Octave, MD 02/17/13 [phone]

## 2013-02-16 NOTE — ED Notes (Addendum)
Pt was referred from urgent care to CT for lower right abd pain. Pt was sent to ED from CT with results of acute appendicitis. Pt reports he had nausea yesterday, however no nausea today. Pt currently denies pain. RLQ is tender to palpation. Pt reports lasting drinking at 2030 tonight (oral contrast).

## 2013-02-16 NOTE — Progress Notes (Signed)
10:40 p.m. CT scan results reviewed on Epic. Appendix mildly enlarged but no inflammation. However, due to exam, there is still concern for developing or impending appendicitis.  Discussed results with pt on cell phone. Pt had already been informed of results by radiology and has already registered as a pt in the ER, IV has been placed, has not seen an ER doctor yet.  Pt advised that ER eval and poss surgical eval appropriate for tonight, call with any questions or concerns.  Reviewed documentation and agree w/ assessment and plan. Norberto Sorenson, MD MPH

## 2013-02-16 NOTE — H&P (Signed)
JOS CYGAN is an 42 y.o. male.   Chief Complaint: abd pain HPI: this is a 42 year old male who presents to the emergency department with approximately 24 hours of generalized abdominal pain and bloating. This localized to his right lower quadrant. He was seen at a local urgent care where labs were drawn. He did not have an elevated white blood cell count so they sent him to [facility] ED for evaluation by CT scan.  He is having some mild nausea but no vomiting. He describes some chills but no overt fevers.  He has not had any changes in his bowel habits. He denies dysuria or flank pain.  Past Medical History  Diagnosis Date  . ASTHMA 04/18/2009    Qualifier: Diagnosis of  By: Gabriel Rung LPN, Harriett Sine    . Personal history of urinary calculi 04/18/2009    Qualifier: Diagnosis of  By: Gabriel Rung LPN, Harriett Sine    . HYPERLIPIDEMIA 06/06/2010    Qualifier: Diagnosis of  By: Caryl Never MD, [name]    . 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 Tonsillectomy    . Tonsillectomy    . Knee surgery      arthroscopic left  . Lumbar laminectomy/decompression microdiscectomy  10/16/2011    Procedure: LUMBAR LAMINECTOMY/DECOMPRESSION MICRODISCECTOMY;  Surgeon: Javier Docker, MD;  Location: [facility];  Service: Orthopedics;  Laterality: Right;  Micro Lumbar Decompression L4-L5 on Right    Family History  Problem Relation Age of Onset  . Heart disease Father 27    CAD  . Hypertension Other   . Cancer Other     grandparent, lung  . Stroke Other     grandparent  . Arthritis Mother    Social History:  reports that he quit smoking about 6 months ago. His smoking use included Cigarettes and Cigars. He has a 25 pack-year smoking history. He has quit using smokeless tobacco. He reports that  drinks alcohol. He reports that he does not use illicit drugs.  Allergies: No Known Allergies   (Not in a hospital admission)  Results for orders placed during the hospital  encounter of 02/16/13 (from the past 48 hour(s))  URINALYSIS, ROUTINE W REFLEX MICROSCOPIC     Status: Abnormal   Collection Time    02/16/13 10:46 PM      Result Value Range   Color, Urine YELLOW  YELLOW   APPearance CLEAR  CLEAR   Specific Gravity, Urine >1.046 (*) 1.005 - 1.030   pH 5.0  5.0 - 8.0   Glucose, UA NEGATIVE  NEGATIVE mg/dL   Hgb urine dipstick NEGATIVE  NEGATIVE   Bilirubin Urine NEGATIVE  NEGATIVE   Ketones, ur NEGATIVE  NEGATIVE mg/dL   Protein, ur NEGATIVE  NEGATIVE mg/dL   Urobilinogen, UA 0.2  0.0 - 1.0 mg/dL   Nitrite NEGATIVE  NEGATIVE   Leukocytes, UA NEGATIVE  NEGATIVE   Comment: MICROSCOPIC NOT DONE ON URINES WITH NEGATIVE PROTEIN, BLOOD, LEUKOCYTES, NITRITE, OR GLUCOSE <1000 mg/dL.  CBC WITH DIFFERENTIAL     Status: Abnormal   Collection Time    02/16/13 10:50 PM      Result Value Range   WBC 9.0  4.0 - 10.5 K/uL   RBC 4.23  4.22 - 5.81 MIL/uL   Hemoglobin 13.5  13.0 - 17.0 g/dL   HCT 16.1 (*) 09.6 - 04.5 %   MCV 90.1  78.0 - 100.0 fL   MCH  31.9  26.0 - 34.0 pg   MCHC 35.4  30.0 - 36.0 g/dL   RDW 16.1  09.6 - 04.5 %   Platelets 174  150 - 400 K/uL   Neutrophils Relative % 42 (*) 43 - 77 %   Neutro Abs 3.7  1.7 - 7.7 K/uL   Lymphocytes Relative 48 (*) 12 - 46 %   Lymphs Abs 4.3 (*) 0.7 - 4.0 K/uL   Monocytes Relative 8  3 - 12 %   Monocytes Absolute 0.7  0.1 - 1.0 K/uL   Eosinophils Relative 3  0 - 5 %   Eosinophils Absolute 0.2  0.0 - 0.7 K/uL   Basophils Relative 0  0 - 1 %   Basophils Absolute 0.0  0.0 - 0.1 K/uL  COMPREHENSIVE METABOLIC PANEL     Status: Abnormal   Collection Time    02/16/13 10:50 PM      Result Value Range   Sodium 137  135 - 145 mEq/L   Potassium 4.0  3.5 - 5.1 mEq/L   Chloride 100  96 - 112 mEq/L   CO2 24  19 - 32 mEq/L   Glucose, Bld 98  70 - 99 mg/dL   BUN 11  6 - 23 mg/dL   Creatinine, Ser 4.09  0.50 - 1.35 mg/dL   Calcium 9.5  8.4 - 81.1 mg/dL   Total Protein 7.1  6.0 - 8.3 g/dL   Albumin 3.8  3.5 - 5.2 g/dL    AST 16  0 - 37 U/L   ALT 10  0 - 53 U/L   Alkaline Phosphatase 80  39 - 117 U/L   Total Bilirubin 0.2 (*) 0.3 - 1.2 mg/dL   GFR calc non Af Amer >90  >90 mL/min   GFR calc Af Amer >90  >90 mL/min   Comment: (NOTE)     The eGFR has been calculated using the CKD EPI equation.     This calculation has not been validated in all clinical situations.     eGFR's persistently <90 mL/min signify possible Chronic Kidney     Disease.  LIPASE, BLOOD     Status: None   Collection Time    02/16/13 10:50 PM      Result Value Range   Lipase 40  11 - 59 U/L   Ct Abdomen Pelvis W Contrast  02/16/2013   CLINICAL DATA:  Right lower quadrant abdominal pain. Fever.  EXAM: CT ABDOMEN AND PELVIS WITH CONTRAST  TECHNIQUE: Multidetector CT imaging of the abdomen and pelvis was performed using the standard protocol following bolus administration of intravenous contrast.  CONTRAST:  50mL OMNIPAQUE IOHEXOL 300 MG/ML SOLN, OMNIPAQUE IOHEXOL 300 MG/ML SOLN  COMPARISON:  No priors.  FINDINGS: Lung Bases: Unremarkable.  Abdomen/Pelvis: The appearance of the liver, gallbladder, pancreas, spleen, bilateral adrenal glands and the right kidney is unremarkable. In the upper pole of the left kidney there is a 1.3 cm low-attenuation nonenhancing lesion compatible with a small simple cyst. Atherosclerosis throughout the abdominal and pelvic vasculature, without evidence of aneurysm or dissection at this time. No significant volume of ascites. No pneumoperitoneum. No pathologic distention of small bowel. No definite pathologic lymphadenopathy. The appendix appears mildly dilated measuring up to 10 mm (image 51 of series 4). No overt inflammatory changes are identified adjacent to the appendix at this time. Prostate gland and urinary bladder are unremarkable in appearance.  Musculoskeletal: There are no aggressive appearing lytic or blastic lesions noted in the  visualized portions of the skeleton.  IMPRESSION: 1. Mild dilatation of  the appendix which measures up to 10 mm. No overt surrounding inflammatory changes are noted at this time, however, clinical correlation for signs and symptoms of early acute appendicitis is strongly recommended. Normal appendix measurements are typically less than 6 mm in normal adult patients, and the vast majority of the time are less than 8 mm. These results were called by telephone at the time of interpretation on 02/16/2013 at 10:28 PMto Dr. Manus Gunning in the Emergency Department a, who verbally acknowledged these results.   Electronically Signed   By: Trudie Reed M.D.   On: 02/16/2013 22:28    Review of Systems  Constitutional: Positive for chills. Negative for fever.  Respiratory: Negative for cough and shortness of breath.   Cardiovascular: Negative for chest pain.  Gastrointestinal: Positive for nausea and abdominal pain. Negative for vomiting and blood in stool.  Genitourinary: Negative for dysuria, urgency, frequency, hematuria and flank pain.  Musculoskeletal: Negative for myalgias.  Neurological: Negative for dizziness and headaches.    Blood pressure 149/73, pulse 55, temperature 98.9 F (37.2 C), temperature source Oral, resp. rate 16, SpO2 97.00%. Physical Exam  Constitutional: He is oriented to person, place, and time. He appears well-developed and well-nourished.  HENT:  Head: Normocephalic and atraumatic.  Eyes: Conjunctivae are normal. Pupils are equal, round, and reactive to light.  Neck: Normal range of motion.  Cardiovascular: Normal rate and regular rhythm.   Respiratory: Effort normal and breath sounds normal. No respiratory distress.  GI: Soft. Bowel sounds are normal. He exhibits no distension. There is tenderness. There is no rebound and no guarding.  RLQ  Musculoskeletal: Normal range of motion.  Neurological: He is alert and oriented to person, place, and time.  Skin: Skin is warm and dry.     Assessment/Plan DAYTON KENLEY is a 42 y.o. M with what  appears to be acute appendicitis.  His physical exam is concerning for significant right lower quadrant pain. A CT scan shows a dilated appendix with no fat stranding noted. His white blood cell count is normal.   We discussed the risk and benefits of a laparoscopic appendectomy. These mainly include bleeding, infection, staple line leak and damage to adjacent structures. He understands that most of these risks would be relatively low. The chances that this operation will make his pain better are very good.  I believe he understands this and agreed to proceed.  Avraham, Nerissa Constantin C. 02/16/2013, 11:48 PM

## 2013-02-16 NOTE — Progress Notes (Signed)
   [address], [place] [address]   Phone [phone]  Subjective:    Patient ID: Brett Morgan, male    DOB: 01/25/1971, 42 y.o.   MRN: [id number]  HPI Pt presents to clinic with abd pain that started abruptly yesterday about 11am.  The pain has not improved though yesterday it was midline of abd and today it seems to be more on the right side.  The pain is there all the time and does not seem to be aggravated by movement though yesterday he did have trouble finding a comfortable position.  He has not felt well since the pain has started with nausea but no vomiting and decrease appetite.  Tried to eat some crackers today but they made him feel a little worse.  He does not feel like he has a stomach virus.  No one he has been around is sick with similar symptoms.  Review of Systems  Constitutional: Positive for chills and appetite change (decreased). Negative for fever.  Gastrointestinal: Positive for nausea and abdominal pain. Negative for vomiting, diarrhea, constipation and blood in stool.      Objective:   Physical Exam  Constitutional: He appears well-developed and well-nourished.  HENT:  Head: Normocephalic and atraumatic.  Right Ear: External ear normal.  Eyes: Conjunctivae are normal.  Neck: Normal range of motion.  Cardiovascular: Normal rate, regular rhythm and normal heart sounds.   No murmur heard. Pulmonary/Chest: Effort normal and breath sounds normal.  Abdominal: Soft. He exhibits no pulsatile midline mass. There is no hepatosplenomegaly. There is tenderness (mild TTP RUQ but most of TTP is over McBurneys point.  He has + Rosvings) in the right upper quadrant, right lower quadrant and periumbilical area. There is rebound, guarding and tenderness at McBurney's point. There is no CVA tenderness.  Skin: Skin is warm and dry.  Psychiatric: He has a normal mood and affect. His behavior is normal. Judgment and thought content normal.   Results for orders placed in visit on  02/16/13  POCT CBC      Result Value Range   WBC 6.8  4.6 - 10.2 K/uL   Lymph, poc 2.8  0.6 - 3.4   POC LYMPH PERCENT 41.4  10 - 50 %L   MID (cbc) 0.5  0 - 0.9   POC MID % 7.2  0 - 12 %M   POC Granulocyte 3.5  2 - 6.9   Granulocyte percent 51.4  37 - 80 %G   RBC 4.07 (*) 4.69 - 6.13 M/uL   Hemoglobin 12.6 (*) 14.1 - 18.1 g/dL   HCT, POC 21.3 (*) 08.6 - 53.7 %   MCV 96.9  80 - 97 fL   MCH, POC 31.0  27 - 31.2 pg   MCHC 32.0  31.8 - 35.4 g/dL   RDW, POC 57.8     Platelet Count, POC 174  142 - 424 K/uL   MPV 10.7  0 - 99.8 fL       Assessment & Plan:  RLQ abdominal pain - Plan: POCT CBC, CT Abdomen Pelvis W Contrast, CANCELED: CT Abdomen Pelvis W Contrast  I am concerned about his appendix even with a normal white count he has an impressive exam with a low grade fever. He will agreeable to the CT vs monitor.  D/w Dr Arva Chafe PA-C 02/16/2013 9:31 PM

## 2013-02-16 NOTE — Patient Instructions (Addendum)
Go to [facility]. Register at the emergency department. REGISTER AS OUTPATIENT FOR CT.

## 2013-02-17 ENCOUNTER — Encounter (HOSPITAL_COMMUNITY): Payer: Self-pay | Admitting: Anesthesiology

## 2013-02-17 ENCOUNTER — Emergency Department (HOSPITAL_COMMUNITY): Payer: BC Managed Care – PPO | Admitting: Anesthesiology

## 2013-02-17 DIAGNOSIS — K3580 Unspecified acute appendicitis: Secondary | ICD-10-CM

## 2013-02-17 DIAGNOSIS — Z9049 Acquired absence of other specified parts of digestive tract: Secondary | ICD-10-CM

## 2013-02-17 MED ORDER — SODIUM CHLORIDE 0.9 % IV SOLN
1.0000 g | INTRAVENOUS | Status: AC
  Administered 2013-02-17: 1 g via INTRAVENOUS
  Filled 2013-02-17: qty 1

## 2013-02-17 MED ORDER — HYDROMORPHONE HCL PF 1 MG/ML IJ SOLN: 0.2500 mg | INTRAMUSCULAR | Status: DC | PRN

## 2013-02-17 MED ORDER — LACTATED RINGERS IV SOLN: INTRAVENOUS | Status: DC

## 2013-02-17 MED ORDER — SUCCINYLCHOLINE CHLORIDE 20 MG/ML IJ SOLN
INTRAMUSCULAR | Status: DC | PRN
  Administered 2013-02-17: 100 mg via INTRAVENOUS

## 2013-02-17 MED ORDER — PROPOFOL 10 MG/ML IV BOLUS
INTRAVENOUS | Status: DC | PRN
  Administered 2013-02-17: 150 mg via INTRAVENOUS

## 2013-02-17 MED ORDER — LACTATED RINGERS IV SOLN
INTRAVENOUS | Status: DC | PRN
  Administered 2013-02-17: via INTRAVENOUS

## 2013-02-17 MED ORDER — OXYCODONE-ACETAMINOPHEN 5-325 MG PO TABS: 1.0000 | ORAL_TABLET | ORAL | Status: DC | PRN

## 2013-02-17 MED ORDER — GLYCOPYRROLATE 0.2 MG/ML IJ SOLN
INTRAMUSCULAR | Status: DC | PRN
  Administered 2013-02-17: .8 mg via INTRAVENOUS

## 2013-02-17 MED ORDER — ALBUTEROL SULFATE HFA 108 (90 BASE) MCG/ACT IN AERS
2.0000 | INHALATION_SPRAY | Freq: Four times a day (QID) | RESPIRATORY_TRACT | Status: DC | PRN
  Filled 2013-02-17: qty 6.7

## 2013-02-17 MED ORDER — NEOSTIGMINE METHYLSULFATE 1 MG/ML IJ SOLN
INTRAMUSCULAR | Status: DC | PRN
  Administered 2013-02-17: 4 mg via INTRAVENOUS

## 2013-02-17 MED ORDER — 0.9 % SODIUM CHLORIDE (POUR BTL) OPTIME
TOPICAL | Status: DC | PRN
  Administered 2013-02-17: 1000 mL

## 2013-02-17 MED ORDER — ENOXAPARIN SODIUM 40 MG/0.4ML ~~LOC~~ SOLN: 40.0000 mg | SUBCUTANEOUS | Status: DC

## 2013-02-17 MED ORDER — ONDANSETRON HCL 4 MG/2ML IJ SOLN: 4.0000 mg | Freq: Four times a day (QID) | INTRAMUSCULAR | Status: DC | PRN

## 2013-02-17 MED ORDER — INFLUENZA VAC SPLIT QUAD 0.5 ML IM SUSP: 0.5000 mL | INTRAMUSCULAR | Status: DC | PRN

## 2013-02-17 MED ORDER — SODIUM CHLORIDE 0.9 % IV SOLN
INTRAVENOUS | Status: AC
  Filled 2013-02-17: qty 1

## 2013-02-17 MED ORDER — INFLUENZA VAC SPLIT QUAD 0.5 ML IM SUSP
0.5000 mL | INTRAMUSCULAR | Status: AC
  Administered 2013-02-17: 0.5 mL via INTRAMUSCULAR
  Filled 2013-02-17: qty 0.5

## 2013-02-17 MED ORDER — ROCURONIUM BROMIDE 100 MG/10ML IV SOLN
INTRAVENOUS | Status: DC | PRN
  Administered 2013-02-17: 30 mg via INTRAVENOUS

## 2013-02-17 MED ORDER — INFLUENZA VAC SPLIT QUAD 0.5 ML IM SUSP: 0.5000 mL | INTRAMUSCULAR | Status: DC

## 2013-02-17 MED ORDER — FENTANYL CITRATE 0.05 MG/ML IJ SOLN
INTRAMUSCULAR | Status: DC | PRN
  Administered 2013-02-17 (×3): 50 ug via INTRAVENOUS
  Administered 2013-02-17: 100 ug via INTRAVENOUS

## 2013-02-17 MED ORDER — LACTATED RINGERS IR SOLN
Status: DC | PRN
  Administered 2013-02-17: 1000 mL

## 2013-02-17 MED ORDER — SODIUM CHLORIDE 0.9 % IV SOLN
INTRAVENOUS | Status: DC
  Administered 2013-02-17: 75 mL/h via INTRAVENOUS

## 2013-02-17 MED ORDER — HYDROMORPHONE HCL PF 1 MG/ML IJ SOLN
INTRAMUSCULAR | Status: DC | PRN
  Administered 2013-02-17 (×2): 1 mg via INTRAVENOUS

## 2013-02-17 MED ORDER — LIDOCAINE HCL (CARDIAC) 20 MG/ML IV SOLN
INTRAVENOUS | Status: DC | PRN
  Administered 2013-02-17: 50 mg via INTRAVENOUS

## 2013-02-17 MED ORDER — ONDANSETRON HCL 4 MG PO TABS: 4.0000 mg | ORAL_TABLET | Freq: Four times a day (QID) | ORAL | Status: DC | PRN

## 2013-02-17 MED ORDER — MORPHINE SULFATE 2 MG/ML IJ SOLN: 2.0000 mg | INTRAMUSCULAR | Status: DC | PRN

## 2013-02-17 MED ORDER — BUPIVACAINE-EPINEPHRINE 0.5% -1:200000 IJ SOLN
INTRAMUSCULAR | Status: AC
  Filled 2013-02-17: qty 1

## 2013-02-17 MED ORDER — ONDANSETRON HCL 4 MG/2ML IJ SOLN
INTRAMUSCULAR | Status: DC | PRN
  Administered 2013-02-17: 4 mg via INTRAVENOUS

## 2013-02-17 MED ORDER — MIDAZOLAM HCL 5 MG/5ML IJ SOLN
INTRAMUSCULAR | Status: DC | PRN
  Administered 2013-02-17: 2 mg via INTRAVENOUS

## 2013-02-17 MED ORDER — BUPIVACAINE-EPINEPHRINE 0.5% -1:200000 IJ SOLN
INTRAMUSCULAR | Status: DC | PRN
  Administered 2013-02-17: 20 mL

## 2013-02-17 NOTE — Transfer of Care (Addendum)
Immediate Anesthesia Transfer of Care Note  Patient: Brett Morgan  Procedure(s) Performed: Procedure(s) (LRB): APPENDECTOMY LAPAROSCOPIC (N/A)  Patient Location: PACU  Anesthesia Type: General  Level of Consciousness: sedated, patient cooperative and responds to stimulation  Airway & Oxygen Therapy: Patient Spontanous Breathing and Patient connected to face mask oxgen  Post-op Assessment: Report given to PACU RN and Post -op Vital signs reviewed and stable  Post vital signs: Reviewed and stable  Complications: No apparent anesthesia complications

## 2013-02-17 NOTE — Op Note (Signed)
Brett Morgan [id number]   PRE-OPERATIVE DIAGNOSIS:  acute appendicitis  POST-OPERATIVE DIAGNOSIS:  acute appendicitis  PROCEDURE:  Procedure(s): APPENDECTOMY LAPAROSCOPIC  SURGEON:   Romie Levee, MD  ASSISTANT: none   ANESTHESIA:   local and general  EBL:   50ml  Delay start of Pharmacological VTE agent (>24hrs) due to surgical blood loss or risk of bleeding:  no  DRAINS: none   SPECIMEN:  Source of Specimen:  appendix  DISPOSITION OF SPECIMEN:  PATHOLOGY  COUNTS:  YES  PLAN OF CARE: Admit for overnight observation  PATIENT DISPOSITION:  PACU - hemodynamically stable.   INDICATIONS: Patient with concerning symptoms & work up suspicious for appendicitis.  Surgery was recommended:  The anatomy & physiology of the digestive tract was discussed.  The pathophysiology of appendicitis was discussed.  Natural history risks without surgery was discussed.   I feel the risks of no intervention will lead to serious problems that outweigh the operative risks; therefore, I recommended diagnostic laparoscopy with removal of appendix to remove the pathology.  Laparoscopic & open techniques were discussed.   I noted a good likelihood this will help address the problem.    Risks such as bleeding, infection, abscess, leak, reoperation, possible ostomy, hernia, heart attack, death, and other risks were discussed.  Goals of post-operative recovery were discussed as well.  We will work to minimize complications.  Questions were answered.  The patient expresses understanding & wishes to proceed with surgery.  OR FINDINGS: this is a 42 year old male who presents to the emergency department with approximately 24 hours of acute abdominal pain. This is located to the right lower quadrant.  A CT scan was performed which showed a dilated appendix and concern for early acute appendicitis. The risks and benefits of a laparoscopic appendectomy were explained to the patient prior to the OR and consent  was signed and placed on chart.  DESCRIPTION:   The patient was identified & brought into the operating room. The patient was positioned supine with left arm tucked. SCDs were active during the entire case. The patient underwent general anesthesia without any difficulty.  A foley catheter was inserted under sterile conditions. The abdomen was prepped and draped in a sterile fashion. A Surgical Timeout confirmed our plan.   I made a transverse incision through the superior umbilical fold.  I made a nick in the infraumbilical fascia and confirmed peritoneal entry.  I placed a stay suture and then the [facility] port.  We induced carbon dioxide insufflation.  Camera inspection revealed no injury.  I placed additional ports under direct laparoscopic visualization.  I mobilized the terminal ileum to proximal ascending colon in a lateral to medial fashion.  I took care to avoid injuring any retroperitoneal structures.   I freed the appendix off its attachments to the ascending colon and cecal mesentery.  I elevated the appendix. I skeletonized & ligated the mesoappendix. I was able to free off the base of the appendix which was still viable.  I stapled the appendix off the cecum using a laparoscopic stapler. I took a healthy cuff viable cecum.  I placed the appendix inside an EndoCatch bag and removed out the [place] port.  I did copious irrigation. Hemostasis was good in the mesoappendix, colon mesentery, and retroperitoneum. Staple line was intact on the cecum with no bleeding. I washed out the pelvis, retrohepatic space and right paracolic gutter. I washed out the left side as well.  Hemostasis is good. There was no perforation or injury.  Because the area cleaned up well after irrigation, I did not place a drain.  I aspirated the carbon dioxide. I removed the ports. I closed the umbilical fascia site using a 0 Vicryl stitch. I closed skin using 4-0 vicryl stitch.  Sterile dressings were applied.  Patient was  extubated and sent to the recovery room.  I discussed the operative findings with the patient's  family. I suspect the patient is going used in the hospital at least overnight. Questions answered. They expressed understanding and appreciation.

## 2013-02-17 NOTE — Care Management Note (Signed)
    Page 1 of 1   02/17/2013     10:40:18 AM   CARE MANAGEMENT NOTE 02/17/2013  Patient:  Brett Morgan, Brett Morgan   Account Number:  [id number]  Date Initiated:  02/17/2013  Documentation initiated by:  Lorenda Ishihara  Subjective/Objective Assessment:   42 yo male admitted s/p lap appy.     Action/Plan:   Home when stable   Anticipated DC Date:  02/17/2013   Anticipated DC Plan:  HOME/SELF CARE      DC Planning Services  CM consult      Choice offered to / List presented to:             Status of service:  Completed, signed off Medicare Important Message given?   (If response is "NO", the following Medicare IM given date fields will be blank) Date Medicare IM given:   Date Additional Medicare IM given:    Discharge Disposition:  HOME/SELF CARE  Per UR Regulation:  Reviewed for med. necessity/level of care/duration of stay  If discussed at Long Length of Stay Meetings, dates discussed:    Comments:

## 2013-02-17 NOTE — Discharge Summary (Signed)
Physician Discharge Summary  Patient ID: Brett Morgan MRN: [id number] DOB/AGE: 1971/05/31 42 y.o.  Admit date: 02/16/2013 Discharge date: 02/17/2013  Admitting Diagnosis: ACUTE APPENDICITIS  Discharge Diagnosis Patient Active Problem List   Diagnosis Date Noted  . Acute appendicitis without mention of peritonitis 02/17/2013  . S/P laparoscopic appendectomy 02/17/2013  . HYPERLIPIDEMIA 06/06/2010  . ASTHMA 04/18/2009  . PERSONAL HISTORY OF URINARY CALCULI 04/18/2009    Consultants none  Imaging: Ct Abdomen Pelvis W Contrast  02/16/2013   CLINICAL DATA:  Right lower quadrant abdominal pain. Fever.  EXAM: CT ABDOMEN AND PELVIS WITH CONTRAST  TECHNIQUE: Multidetector CT imaging of the abdomen and pelvis was performed using the standard protocol following bolus administration of intravenous contrast.  CONTRAST:  50mL OMNIPAQUE IOHEXOL 300 MG/ML SOLN, OMNIPAQUE IOHEXOL 300 MG/ML SOLN  COMPARISON:  No priors.  FINDINGS: Lung Bases: Unremarkable.  Abdomen/Pelvis: The appearance of the liver, gallbladder, pancreas, spleen, bilateral adrenal glands and the right kidney is unremarkable. In the upper pole of the left kidney there is a 1.3 cm low-attenuation nonenhancing lesion compatible with a small simple cyst. Atherosclerosis throughout the abdominal and pelvic vasculature, without evidence of aneurysm or dissection at this time. No significant volume of ascites. No pneumoperitoneum. No pathologic distention of small bowel. No definite pathologic lymphadenopathy. The appendix appears mildly dilated measuring up to 10 mm (image 51 of series 4). No overt inflammatory changes are identified adjacent to the appendix at this time. Prostate gland and urinary bladder are unremarkable in appearance.  Musculoskeletal: There are no aggressive appearing lytic or blastic lesions noted in the visualized portions of the skeleton.  IMPRESSION: 1. Mild dilatation of the appendix which measures up to 10 mm.  No overt surrounding inflammatory changes are noted at this time, however, clinical correlation for signs and symptoms of early acute appendicitis is strongly recommended. Normal appendix measurements are typically less than 6 mm in normal adult patients, and the vast majority of the time are less than 8 mm. These results were called by telephone at the time of interpretation on 02/16/2013 at 10:28 PMto Dr. Manus Gunning in the Emergency Department a, who verbally acknowledged these results.   Electronically Signed   By: Trudie Reed M.D.   On: 02/16/2013 22:28    Procedures Laparoscopic Appendectomy Dr. Maisie Fus 02/17/13  Hospital Course:  Brett Morgan is a WM with a history of HLD, asthma who presented to [facility] with abdominal pain.  Workup showed acute appendicitis without perforation.  Patient was admitted and underwent procedure listed above.  Tolerated procedure well and was transferred to the floor.  Diet was advanced as tolerated.  On POD #1, the patient was voiding well, tolerating diet, ambulating well, pain well controlled, vital signs stable, incisions c/d/i and felt stable for discharge home.  Patient will follow up in our office in 3 weeks and knows to call with questions or concerns.  Physical Exam: General:  Alert, NAD, pleasant, comfortable Abd:  Soft, ND, mild tenderness, incisions C/D/I    Medication List         albuterol 108 (90 BASE) MCG/ACT inhaler  Commonly known as:  PROVENTIL HFA;VENTOLIN HFA  Inhale 2 puffs into the lungs every 6 (six) hours as needed for wheezing.     aspirin EC 81 MG tablet  Take 81 mg by mouth 2 (two) times daily.     CHOLEST OFF COMPLETE 450-75 MG Tabs  Generic drug:  Plant Sterol Stanol-Pantethine  Take 1 tablet by mouth 2 (two)  times daily.     multivitamin with minerals Tabs tablet  Take 1 tablet by mouth daily.     oxyCODONE-acetaminophen 5-325 MG per tablet  Commonly known as:  PERCOCET/ROXICET  Take 1 tablet by mouth every 4 (four) hours  as needed.             Follow-up Information   Follow up with [facility] On 03/08/2013. (appointment time: 2:45p.  arrive no later than 2:15p.  this is a post op check with advance practice provider)    Contact information:   [address]   [place] [address] [phone]       Signed: Ashok Norris, [facility] [phone]  02/17/2013, 7:54 AM

## 2013-02-17 NOTE — Anesthesia Preprocedure Evaluation (Addendum)
Anesthesia Evaluation  Patient identified by MRN, date of birth, ID band Patient awake    Reviewed: Allergy & Precautions, H&P , NPO status , Patient's Chart, lab work & pertinent test results  Airway Mallampati: II TM Distance: >3 FB Neck ROM: full    Dental no notable dental hx. (+) Teeth Intact and Dental Advisory Given   Pulmonary asthma ,  breath sounds clear to auscultation  Pulmonary exam normal       Cardiovascular Exercise Tolerance: Good negative cardio ROS  Rhythm:regular Rate:Normal     Neuro/Psych negative neurological ROS  negative psych ROS   GI/Hepatic negative GI ROS, Neg liver ROS,   Endo/Other  negative endocrine ROS  Renal/GU negative Renal ROS  negative genitourinary   Musculoskeletal   Abdominal   Peds  Hematology negative hematology ROS (+)   Anesthesia Other Findings   Reproductive/Obstetrics negative OB ROS                          Anesthesia Physical Anesthesia Plan  ASA: II and emergent  Anesthesia Plan: General   Post-op Pain Management:    Induction: Intravenous, Rapid sequence and Cricoid pressure planned  Airway Management Planned: Oral ETT  Additional Equipment:   Intra-op Plan:   Post-operative Plan: Extubation in OR  Informed Consent: I have reviewed the patients History and Physical, chart, labs and discussed the procedure including the risks, benefits and alternatives for the proposed anesthesia with the patient or authorized representative who has indicated his/her understanding and acceptance.   Dental Advisory Given  Plan Discussed with: CRNA and Surgeon  Anesthesia Plan Comments:         Anesthesia Quick Evaluation

## 2013-02-17 NOTE — Progress Notes (Signed)
Discharge instructions reviewed with patient, patient to follow up with MD, incision is within normal limits, vital signs are stable, patient is tolerating diet with no complaints of nausea or vomiting, iv removed and flu vaccine administered Stanford Breed RN 02-17-2013 9:47am

## 2013-02-17 NOTE — Anesthesia Postprocedure Evaluation (Signed)
  Anesthesia Post-op Note  Patient: Brett Morgan  Procedure(s) Performed: Procedure(s) (LRB): APPENDECTOMY LAPAROSCOPIC (N/A)  Patient Location: PACU  Anesthesia Type: General  Level of Consciousness: awake and alert   Airway and Oxygen Therapy: Patient Spontanous Breathing  Post-op Pain: mild  Post-op Assessment: Post-op Vital signs reviewed, Patient's Cardiovascular Status Stable, Respiratory Function Stable, Patent Airway and No signs of Nausea or vomiting  Last Vitals:  Filed Vitals:   02/17/13 0145  BP:   Pulse:   Temp: 36.7 C  Resp:     Post-op Vital Signs: stable   Complications: No apparent anesthesia complications

## 2013-02-17 NOTE — Discharge Summary (Signed)
Agree with above.  Straightforward appendicitis.

## 2013-02-18 ENCOUNTER — Encounter (HOSPITAL_COMMUNITY): Payer: Self-pay | Admitting: General Surgery

## 2013-03-08 ENCOUNTER — Ambulatory Visit (INDEPENDENT_AMBULATORY_CARE_PROVIDER_SITE_OTHER): Payer: BC Managed Care – PPO | Admitting: General Surgery

## 2013-03-08 ENCOUNTER — Encounter (INDEPENDENT_AMBULATORY_CARE_PROVIDER_SITE_OTHER): Payer: Self-pay

## 2013-03-08 VITALS — BP 118/78 | HR 76 | Temp 99.3°F | Resp 14 | Ht 71.0 in | Wt 233.8 lb

## 2013-03-08 DIAGNOSIS — K3580 Unspecified acute appendicitis: Secondary | ICD-10-CM

## 2013-03-08 NOTE — Progress Notes (Signed)
Brett Morgan [facility] 12-10-1970 [id number] 03/08/2013   History of Present Illness: Brett Morgan is a  42 y.o. male who presents today status post lap appy by Dr. Maisie Fus.  Pathology reveals acute appendiciti.  The patient is tolerating a regular diet, having normal bowel movements, has good pain control.  He  is back to most normal activities.   Physical Exam: Abd: soft, nontender, active bowel sounds, nondistended.  All incisions are well healed.  Impression: 1.  Acute appendicitis, s/p lap appy  Plan: He  is able to return to normal activities. He  may follow up on a prn basis.

## 2013-03-08 NOTE — Patient Instructions (Addendum)
Follow up as needed

## 2013-04-05 ENCOUNTER — Ambulatory Visit (INDEPENDENT_AMBULATORY_CARE_PROVIDER_SITE_OTHER): Payer: BC Managed Care – PPO | Admitting: Podiatry

## 2013-04-05 ENCOUNTER — Encounter: Payer: Self-pay | Admitting: Podiatry

## 2013-04-05 VITALS — BP 137/78 | HR 64 | Resp 16 | Ht 71.0 in | Wt 224.0 lb

## 2013-04-05 DIAGNOSIS — S90122D Contusion of left lesser toe(s) without damage to nail, subsequent encounter: Secondary | ICD-10-CM

## 2013-04-05 DIAGNOSIS — Z5189 Encounter for other specified aftercare: Secondary | ICD-10-CM

## 2013-04-05 DIAGNOSIS — L600 Ingrowing nail: Secondary | ICD-10-CM

## 2013-04-05 NOTE — Progress Notes (Signed)
Brett Morgan presents today with a concern regarding a hallux nail left which he had a portion of it removed several months ago. He concerned it may be growing back in. He is also concerned about the second digit of his right foot and a black toenail. He states is at runner and a running half marathon and in doing so he noticed that his nails turning dark.  Objective: Vital signs are stable he is alert and oriented x3 pulses are palpable bilateral. Evaluation of the fibular border of the hallux left demonstrates a small nail spicule that is growing elongated as the nail grows out from his matrixectomy. I snipped into this off and it made it feel much better. I did suggest with him to continue to keep the margin clean and he we'll do so. The contralateral foot demonstrates subungual hematoma to the second digital nail plate right does nail plate is loose a more than likely grown out and followup with in the next few weeks his keeping is tied down with a piece of tape so that it doesn't rub and socks.  Assessment: Nail spicule hallux left fibular border status post matrixectomy. Subungual hematoma second digit right.  Plan: Debridement of nail debridement of calluses followup with him as needed.

## 2013-04-28 ENCOUNTER — Other Ambulatory Visit: Payer: Self-pay | Admitting: Family Medicine

## 2013-06-29 ENCOUNTER — Encounter: Payer: Self-pay | Admitting: Family Medicine

## 2013-06-29 ENCOUNTER — Ambulatory Visit (INDEPENDENT_AMBULATORY_CARE_PROVIDER_SITE_OTHER): Payer: BC Managed Care – PPO | Admitting: Family Medicine

## 2013-06-29 VITALS — BP 130/76 | HR 73 | Temp 98.3°F | Wt 237.0 lb

## 2013-06-29 DIAGNOSIS — J019 Acute sinusitis, unspecified: Secondary | ICD-10-CM

## 2013-06-29 MED ORDER — AMOXICILLIN-POT CLAVULANATE 875-125 MG PO TABS: 1.0000 | ORAL_TABLET | Freq: Two times a day (BID) | ORAL | Status: DC

## 2013-06-29 NOTE — Progress Notes (Signed)
Pre visit review using our clinic review tool, if applicable. No additional management support is needed unless otherwise documented below in the visit note. 

## 2013-06-29 NOTE — Progress Notes (Signed)
   Subjective:    Patient ID: Brett Morgan, male    DOB: 1970/08/07, 43 y.o.   MRN: [id number]  HPI Acute visit for possible sinus infection. Patient developed symptoms last weekend of sinus congestion and facial pain. Facial pain centered over left frontal sinus. He denies any cough. No significant headaches. Green to yellow nasal discharge left naris. Increased malaise. No definite fever or chills. Has taken analgesics with slight relief.  Nonsmoker. Currently training for half marathon but has not run for the past few days secondary to nasal congestion and malaise  Past Medical History  Diagnosis Date  . ASTHMA 04/18/2009    Qualifier: Diagnosis of  By: Valma Cava LPN, Izora Gala    . Personal history of urinary calculi 04/18/2009    Qualifier: Diagnosis of  By: Valma Cava LPN, Izora Gala    . HYPERLIPIDEMIA 06/06/2010    Qualifier: Diagnosis of  By: Elease Hashimoto MD, Hansford Hirt    . 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 Tonsillectomy    . Tonsillectomy    . Knee surgery      arthroscopic left  . Lumbar laminectomy/decompression microdiscectomy  10/16/2011    Procedure: LUMBAR LAMINECTOMY/DECOMPRESSION MICRODISCECTOMY;  Surgeon: Johnn Hai, MD;  Location: [facility];  Service: Orthopedics;  Laterality: Right;  Micro Lumbar Decompression L4-L5 on Right  . Laparoscopic appendectomy N/A 02/16/2013    Procedure: APPENDECTOMY LAPAROSCOPIC;  Surgeon: Leighton Ruff, MD;  Location: [facility];  Service: General;  Laterality: N/A;  . Appendectomy      reports that he quit smoking about 10 months ago. His smoking use included Cigarettes and Cigars. He has a 25 pack-year smoking history. He has quit using smokeless tobacco. He reports that he drinks alcohol. He reports that he does not use illicit drugs. family history includes Arthritis in his mother; Cancer in his other; Heart disease (age of onset: 94) in his father; Hypertension in his other; Stroke in his  other. No Known Allergies    Review of Systems  Constitutional: Positive for fatigue. Negative for fever and chills.  HENT: Positive for congestion and sinus pressure.   Respiratory: Negative for cough and shortness of breath.   Neurological: Negative for dizziness.       Objective:   Physical Exam  Constitutional: He appears well-developed and well-nourished.  HENT:  Right Ear: External ear normal.  Left Ear: External ear normal.  Mouth/Throat: Oropharynx is clear and moist.  Erythematous nasal mucosa bilaterally otherwise clear  Neck: Neck supple.  Cardiovascular: Normal rate and regular rhythm.   Pulmonary/Chest: Effort normal and breath sounds normal. No respiratory distress. He has no wheezes. He has no rales.  Lymphadenopathy:    He has no cervical adenopathy.          Assessment & Plan:  Acute sinusitis. Patient has localizing pain left frontal sinus. Augmentin 875 mg twice daily for 10 days. Continue saline nasal irrigation. Followup if no better in one to 2 weeks

## 2013-06-29 NOTE — Patient Instructions (Signed)

## 2013-08-25 ENCOUNTER — Other Ambulatory Visit: Payer: Self-pay | Admitting: Family Medicine

## 2013-11-04 ENCOUNTER — Other Ambulatory Visit: Payer: Self-pay | Admitting: Family Medicine

## 2014-03-22 ENCOUNTER — Ambulatory Visit (INDEPENDENT_AMBULATORY_CARE_PROVIDER_SITE_OTHER): Payer: BC Managed Care – PPO

## 2014-03-22 DIAGNOSIS — Z23 Encounter for immunization: Secondary | ICD-10-CM

## 2014-04-24 ENCOUNTER — Other Ambulatory Visit: Payer: Self-pay | Admitting: Family Medicine

## 2014-08-06 ENCOUNTER — Other Ambulatory Visit: Payer: Self-pay | Admitting: Family Medicine

## 2014-09-08 ENCOUNTER — Other Ambulatory Visit: Payer: Self-pay | Admitting: Family Medicine

## 2014-10-08 ENCOUNTER — Other Ambulatory Visit: Payer: Self-pay | Admitting: Family Medicine

## 2014-11-07 ENCOUNTER — Other Ambulatory Visit: Payer: Self-pay | Admitting: Family Medicine

## 2014-12-05 ENCOUNTER — Other Ambulatory Visit: Payer: Self-pay | Admitting: Family Medicine

## 2015-01-20 ENCOUNTER — Other Ambulatory Visit: Payer: Self-pay | Admitting: Family Medicine

## 2015-02-20 ENCOUNTER — Other Ambulatory Visit: Payer: Self-pay | Admitting: Family Medicine

## 2015-02-20 ENCOUNTER — Telehealth: Payer: Self-pay | Admitting: Family Medicine

## 2015-02-20 NOTE — Telephone Encounter (Signed)
Pt has scheduled  an appt for 02-22-15. Pt would like to have ventolin refill sent into [facility] [address]

## 2015-02-21 ENCOUNTER — Other Ambulatory Visit: Payer: Self-pay | Admitting: *Deleted

## 2015-02-21 MED ORDER — ALBUTEROL SULFATE HFA 108 (90 BASE) MCG/ACT IN AERS: 2.0000 | INHALATION_SPRAY | RESPIRATORY_TRACT | Status: DC | PRN

## 2015-02-21 NOTE — Telephone Encounter (Signed)
Rx sent in. Called patient to make aware. No answer.

## 2015-02-21 NOTE — Telephone Encounter (Signed)
Rx for inhaler sent in to [facility] on [address]. Called patient to make aware. No answer.

## 2015-02-22 ENCOUNTER — Ambulatory Visit (INDEPENDENT_AMBULATORY_CARE_PROVIDER_SITE_OTHER): Payer: 59 | Admitting: Family Medicine

## 2015-02-22 ENCOUNTER — Encounter: Payer: Self-pay | Admitting: Family Medicine

## 2015-02-22 VITALS — BP 124/80 | HR 86 | Temp 98.8°F | Ht 71.0 in | Wt 231.1 lb

## 2015-02-22 DIAGNOSIS — Z720 Tobacco use: Secondary | ICD-10-CM

## 2015-02-22 DIAGNOSIS — R059 Cough, unspecified: Secondary | ICD-10-CM

## 2015-02-22 DIAGNOSIS — F17200 Nicotine dependence, unspecified, uncomplicated: Secondary | ICD-10-CM

## 2015-02-22 DIAGNOSIS — R05 Cough: Secondary | ICD-10-CM

## 2015-02-22 MED ORDER — VARENICLINE TARTRATE 1 MG PO TABS: 1.0000 mg | ORAL_TABLET | Freq: Two times a day (BID) | ORAL | Status: DC

## 2015-02-22 MED ORDER — VARENICLINE TARTRATE 0.5 MG X 11 & 1 MG X 42 PO MISC: ORAL | Status: DC

## 2015-02-22 MED ORDER — ALBUTEROL SULFATE HFA 108 (90 BASE) MCG/ACT IN AERS: 2.0000 | INHALATION_SPRAY | RESPIRATORY_TRACT | Status: DC | PRN

## 2015-02-22 NOTE — Progress Notes (Signed)
   Subjective:    Patient ID: Brett Morgan, male    DOB: 1970-07-02, 44 y.o.   MRN: [id number]  HPI Patient here requesting refill Ventolin inhaler. He rarely wheezes but tends to cough early in the mornings occasionally and sometimes feels this loosens up his mucus. He unfortunately resumed smoking recently. He previously had good success with Chantix and requesting refills. He does some running intermittently but has recently scaled back. Denies any appetite or weight changes.  Past Medical History  Diagnosis Date  . ASTHMA 04/18/2009    Qualifier: Diagnosis of  By: Valma Cava LPN, Izora Gala    . Personal history of urinary calculi 04/18/2009    Qualifier: Diagnosis of  By: Valma Cava LPN, Izora Gala    . HYPERLIPIDEMIA 06/06/2010    Qualifier: Diagnosis of  By: Elease Hashimoto MD, [name]    . 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 Tonsillectomy    . Tonsillectomy    . Knee surgery      arthroscopic left  . Lumbar laminectomy/decompression microdiscectomy  10/16/2011    Procedure: LUMBAR LAMINECTOMY/DECOMPRESSION MICRODISCECTOMY;  Surgeon: Johnn Hai, MD;  Location: [facility];  Service: Orthopedics;  Laterality: Right;  Micro Lumbar Decompression L4-L5 on Right  . Laparoscopic appendectomy N/A 02/16/2013    Procedure: APPENDECTOMY LAPAROSCOPIC;  Surgeon: Leighton Ruff, MD;  Location: [facility];  Service: General;  Laterality: N/A;  . Appendectomy      reports that he quit smoking about 2 years ago. His smoking use included Cigarettes and Cigars. He has a 25 pack-year smoking history. He has quit using smokeless tobacco. He reports that he drinks alcohol. He reports that he does not use illicit drugs. family history includes Arthritis in his mother; Cancer in his other; Heart disease (age of onset: 32) in his father; Hypertension in his other; Stroke in his other. No Known Allergies    Review of Systems  Constitutional: Negative for fever and chills.    Respiratory: Negative for cough, shortness of breath and wheezing.   Cardiovascular: Negative for chest pain.       Objective:   Physical Exam  Constitutional: He appears well-developed and well-nourished.  Neck: Neck supple. No thyromegaly present.  Cardiovascular: Normal rate and regular rhythm.   Pulmonary/Chest: Effort normal and breath sounds normal. No respiratory distress. He has no wheezes. He has no rales.  Lymphadenopathy:    He has no cervical adenopathy.          Assessment & Plan:  Ongoing nicotine use. Patient strongly advised to quit. He requests refills of Chantix which he has used previously without any adverse side effects. Prescriptions given. He knows to quit smoking within one week of starting. Refills Ventolin given. We discussed that it is having daily or more frequent wheezing to look at controller medication such as steroid inhaler but this point his symptoms are infrequent.

## 2015-02-22 NOTE — Progress Notes (Signed)
Pre visit review using our clinic review tool, if applicable. No additional management support is needed unless otherwise documented below in the visit note. 

## 2015-07-06 ENCOUNTER — Other Ambulatory Visit: Payer: Self-pay | Admitting: Family Medicine

## 2015-08-10 ENCOUNTER — Other Ambulatory Visit: Payer: Self-pay | Admitting: Family Medicine

## 2015-08-14 ENCOUNTER — Ambulatory Visit (INDEPENDENT_AMBULATORY_CARE_PROVIDER_SITE_OTHER): Payer: BLUE CROSS/BLUE SHIELD

## 2015-08-14 ENCOUNTER — Encounter: Payer: Self-pay | Admitting: Podiatry

## 2015-08-14 ENCOUNTER — Ambulatory Visit (INDEPENDENT_AMBULATORY_CARE_PROVIDER_SITE_OTHER): Payer: BLUE CROSS/BLUE SHIELD | Admitting: Podiatry

## 2015-08-14 VITALS — BP 133/84 | HR 69 | Resp 12

## 2015-08-14 DIAGNOSIS — D3610 Benign neoplasm of peripheral nerves and autonomic nervous system, unspecified: Secondary | ICD-10-CM

## 2015-08-14 DIAGNOSIS — M779 Enthesopathy, unspecified: Secondary | ICD-10-CM

## 2015-08-14 DIAGNOSIS — M7751 Other enthesopathy of right foot: Secondary | ICD-10-CM

## 2015-08-14 DIAGNOSIS — M778 Other enthesopathies, not elsewhere classified: Secondary | ICD-10-CM

## 2015-08-14 MED ORDER — METHYLPREDNISOLONE 4 MG PO TBPK: ORAL_TABLET | ORAL | Status: DC

## 2015-08-14 MED ORDER — MELOXICAM 15 MG PO TABS: 15.0000 mg | ORAL_TABLET | Freq: Every day | ORAL | Status: DC

## 2015-08-14 NOTE — Progress Notes (Signed)
   Subjective:    Patient ID: Brett Morgan, male    DOB: 1970-09-16, 45 y.o.   MRN: [id number]  HPI: He presents today with a 6 month duration of pain to the second metatarsophalangeal joint of the right foot worse than the left. He states that he is an avid runner and has multiple peers of shoes. He noticed the pain began to be intermittent at first and then began to encompass his entire forefoot. He states this seems to be between his first and second toes of his right foot. He's concerned about a neuroma. He states he has not run in the past 3 weeks. He states that his foot was hurting so bad that he could hardly cut the foot to the floor he did like an issue on his foot and is exquisitely painful.    Review of Systems  Musculoskeletal: Positive for gait problem.       Objective:   Physical Exam: Vital signs are stable he is alert and oriented 3 pulses are palpable. Neurologic sensorium is intact. Deep tendon reflexes are intact. Muscle strength is normal. Orthopedic evaluation and x-rays all joints distal to the ankle range of motion without crepitation. The foot appears to be rectus he has pain on end range of motion with dorsiflexion and plantarflexion at the second metatarsophalangeal joint of the right foot. He states that his legs exquisitely painful. Cutaneous evaluation demonstrates no erythema cellulitis drainage or odor mild tyloma to the plantar aspect of the right forefoot subsecond metatarsophalangeal joint more so than the left. Radiographs do not demonstrate any major osseous abnormalities.        Assessment & Plan:  Capsulitis second metatarsophalangeal joint right foot with an elongated plantarflexed second metatarsal.  Plan: Discussed etiology pathology conservative versus surgical therapies. Started him on a Medrol Dosepak to be followed by meloxicam. Discussed the possible need for orthotics in the future. I discussed in great detail with him shoe gear options today  and I will follow-up with him as needed.

## 2015-09-18 ENCOUNTER — Ambulatory Visit: Payer: BLUE CROSS/BLUE SHIELD | Admitting: Podiatry

## 2015-11-02 ENCOUNTER — Other Ambulatory Visit (INDEPENDENT_AMBULATORY_CARE_PROVIDER_SITE_OTHER): Payer: BLUE CROSS/BLUE SHIELD

## 2015-11-02 DIAGNOSIS — Z0000 Encounter for general adult medical examination without abnormal findings: Secondary | ICD-10-CM | POA: Diagnosis not present

## 2015-11-02 LAB — CBC WITH DIFFERENTIAL/PLATELET
BASOS PCT: 0.5 % (ref 0.0–3.0)
Basophils Absolute: 0 10*3/uL (ref 0.0–0.1)
EOS PCT: 1.7 % (ref 0.0–5.0)
Eosinophils Absolute: 0.1 10*3/uL (ref 0.0–0.7)
HEMATOCRIT: 39.1 % (ref 39.0–52.0)
HEMOGLOBIN: 12.9 g/dL — AB (ref 13.0–17.0)
LYMPHS PCT: 32.8 % (ref 12.0–46.0)
Lymphs Abs: 2.7 10*3/uL (ref 0.7–4.0)
MCHC: 33.1 g/dL (ref 30.0–36.0)
MCV: 94.1 fl (ref 78.0–100.0)
Monocytes Absolute: 0.5 10*3/uL (ref 0.1–1.0)
Monocytes Relative: 5.9 % (ref 3.0–12.0)
Neutro Abs: 4.9 10*3/uL (ref 1.4–7.7)
Neutrophils Relative %: 59.1 % (ref 43.0–77.0)
Platelets: 183 10*3/uL (ref 150.0–400.0)
RBC: 4.15 Mil/uL — ABNORMAL LOW (ref 4.22–5.81)
RDW: 13.8 % (ref 11.5–15.5)
WBC: 8.3 10*3/uL (ref 4.0–10.5)

## 2015-11-02 LAB — LIPID PANEL
CHOLESTEROL: 193 mg/dL (ref 0–200)
HDL: 34.3 mg/dL — ABNORMAL LOW (ref >39.00)
LDL Cholesterol: 143 mg/dL — ABNORMAL HIGH (ref 0–99)
NonHDL: 158.87
TRIGLYCERIDES: 77 mg/dL (ref 0.0–149.0)
Total CHOL/HDL Ratio: 6
VLDL: 15.4 mg/dL (ref 0.0–40.0)

## 2015-11-02 LAB — HEPATIC FUNCTION PANEL
ALT: 9 U/L (ref 0–53)
AST: 13 U/L (ref 0–37)
Albumin: 3.9 g/dL (ref 3.5–5.2)
Alkaline Phosphatase: 83 U/L (ref 39–117)
Bilirubin, Direct: 0.1 mg/dL (ref 0.0–0.3)
TOTAL PROTEIN: 6.2 g/dL (ref 6.0–8.3)
Total Bilirubin: 0.4 mg/dL (ref 0.2–1.2)

## 2015-11-02 LAB — BASIC METABOLIC PANEL
BUN: 12 mg/dL (ref 6–23)
CHLORIDE: 107 meq/L (ref 96–112)
CO2: 26 mEq/L (ref 19–32)
Calcium: 9 mg/dL (ref 8.4–10.5)
Creatinine, Ser: 0.81 mg/dL (ref 0.40–1.50)
GFR: 110.13 mL/min (ref >60.00)
Glucose, Bld: 96 mg/dL (ref 70–99)
POTASSIUM: 4.1 meq/L (ref 3.5–5.1)
Sodium: 141 mEq/L (ref 135–145)

## 2015-11-02 LAB — TSH: TSH: 1.42 u[IU]/mL (ref 0.35–4.50)

## 2015-11-07 ENCOUNTER — Encounter: Payer: Self-pay | Admitting: Family Medicine

## 2015-11-07 ENCOUNTER — Ambulatory Visit (INDEPENDENT_AMBULATORY_CARE_PROVIDER_SITE_OTHER): Payer: BLUE CROSS/BLUE SHIELD | Admitting: Family Medicine

## 2015-11-07 VITALS — BP 100/60 | HR 114 | Temp 98.2°F | Ht 70.25 in | Wt 233.0 lb

## 2015-11-07 DIAGNOSIS — Z23 Encounter for immunization: Secondary | ICD-10-CM | POA: Diagnosis not present

## 2015-11-07 DIAGNOSIS — Z0000 Encounter for general adult medical examination without abnormal findings: Secondary | ICD-10-CM

## 2015-11-07 MED ORDER — VARENICLINE TARTRATE 1 MG PO TABS: 1.0000 mg | ORAL_TABLET | Freq: Two times a day (BID) | ORAL | Status: DC

## 2015-11-07 MED ORDER — PROVENTIL HFA 108 (90 BASE) MCG/ACT IN AERS: 1.0000 | INHALATION_SPRAY | Freq: Four times a day (QID) | RESPIRATORY_TRACT | Status: DC | PRN

## 2015-11-07 MED ORDER — VARENICLINE TARTRATE 0.5 MG X 11 & 1 MG X 42 PO MISC: ORAL | Status: DC

## 2015-11-07 NOTE — Progress Notes (Signed)
Subjective:    Patient ID: Brett Morgan, male    DOB: 06-14-1970, 45 y.o.   MRN: [id number]  HPI Physical exam. Patient is still smoking but less than half pack cigarettes per day. He does still exercises regularly. In fact, he did a 14 mild hike this morning. He has taken Chantix in the past He would like to consider trying this again. He uses albuterol as needed for wheezing which is infrequent. Tetanus is due. No family history of colon cancer or prostate cancer. His father did apparently have benign colon polyps  Past Medical History  Diagnosis Date  . ASTHMA 04/18/2009    Qualifier: Diagnosis of  By: Valma Cava LPN, Izora Gala    . Personal history of urinary calculi 04/18/2009    Qualifier: Diagnosis of  By: Valma Cava LPN, Izora Gala    . HYPERLIPIDEMIA 06/06/2010    Qualifier: Diagnosis of  By: Elease Hashimoto MD, [name]    . 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 Tonsillectomy    . Tonsillectomy    . Knee surgery      arthroscopic left  . Lumbar laminectomy/decompression microdiscectomy  10/16/2011    Procedure: LUMBAR LAMINECTOMY/DECOMPRESSION MICRODISCECTOMY;  Surgeon: Johnn Hai, MD;  Location: [facility];  Service: Orthopedics;  Laterality: Right;  Micro Lumbar Decompression L4-L5 on Right  . Laparoscopic appendectomy N/A 02/16/2013    Procedure: APPENDECTOMY LAPAROSCOPIC;  Surgeon: Leighton Ruff, MD;  Location: [facility];  Service: General;  Laterality: N/A;  . Appendectomy      reports that he has been smoking Cigarettes and Cigars.  He has a 25 pack-year smoking history. He has quit using smokeless tobacco. He reports that he drinks alcohol. He reports that he does not use illicit drugs. family history includes Arthritis in his mother; Cancer in his other; Heart disease (age of onset: 60) in his father; Hypertension in his other; Stroke in his other. No Known Allergies    Review of Systems  Constitutional: Negative for fever, activity  change, appetite change and fatigue.  HENT: Negative for congestion, ear pain and trouble swallowing.   Eyes: Negative for pain and visual disturbance.  Respiratory: Negative for cough, shortness of breath and wheezing.   Cardiovascular: Negative for chest pain and palpitations.  Gastrointestinal: Negative for nausea, vomiting, abdominal pain, diarrhea, constipation, blood in stool, abdominal distention and rectal pain.  Genitourinary: Negative for dysuria, hematuria and testicular pain.  Musculoskeletal: Negative for joint swelling and arthralgias.  Skin: Negative for rash.  Neurological: Negative for dizziness, syncope and headaches.  Hematological: Negative for adenopathy.  Psychiatric/Behavioral: Negative for confusion and dysphoric mood.       Objective:   Physical Exam  Constitutional: He is oriented to person, place, and time. He appears well-developed and well-nourished. No distress.  HENT:  Head: Normocephalic and atraumatic.  Right Ear: External ear normal.  Left Ear: External ear normal.  Mouth/Throat: Oropharynx is clear and moist.  Eyes: Conjunctivae and EOM are normal. Pupils are equal, round, and reactive to light.  Neck: Normal range of motion. Neck supple. No thyromegaly present.  Cardiovascular: Normal rate, regular rhythm and normal heart sounds.   No murmur heard. Pulmonary/Chest: No respiratory distress. He has no wheezes. He has no rales.  Abdominal: Soft. Bowel sounds are normal. He exhibits no distension and no mass. There is no tenderness. There is no rebound and no guarding.  Musculoskeletal: He exhibits no edema.  Lymphadenopathy:  He has no cervical adenopathy.  Neurological: He is alert and oriented to person, place, and time. He displays normal reflexes. No cranial nerve deficit.  Skin: No rash noted.  Psychiatric: He has a normal mood and affect.          Assessment & Plan:  Physical exam. Smoking cessation discussed. Labs reviewed. He has  low HDL which has been chronic and essentially unchanged. Tetanus booster given. We discussed repeat trial of Chantix and prescriptions given. He is aware of instructions of how to use. Refill Proventil for as needed use. He is encouraged to lose some weight.  Eulas Post MD [facility]

## 2015-11-07 NOTE — Patient Instructions (Signed)
Smoking Cessation, Tips for Success If you are ready to quit smoking, congratulations! You have chosen to help yourself be healthier. Cigarettes bring nicotine, tar, carbon monoxide, and other irritants into your body. Your lungs, heart, and blood vessels will be able to work better without these poisons. There are many different ways to quit smoking. Nicotine gum, nicotine patches, a nicotine inhaler, or nicotine nasal spray can help with physical craving. Hypnosis, support groups, and medicines help break the habit of smoking. WHAT THINGS CAN I DO TO MAKE QUITTING EASIER?  Here are some tips to help you quit for good:  Pick a date when you will quit smoking completely. Tell all of your friends and family about your plan to quit on that date.  Do not try to slowly cut down on the number of cigarettes you are smoking. Pick a quit date and quit smoking completely starting on that day.  Throw away all cigarettes.   Clean and remove all ashtrays from your home, work, and car.  On a card, write down your reasons for quitting. Carry the card with you and read it when you get the urge to smoke.  Cleanse your body of nicotine. Drink enough water and fluids to keep your urine clear or pale yellow. Do this after quitting to flush the nicotine from your body.  Learn to predict your moods. Do not let a bad situation be your excuse to have a cigarette. Some situations in your life might tempt you into wanting a cigarette.  Never have "just one" cigarette. It leads to wanting another and another. Remind yourself of your decision to quit.  Change habits associated with smoking. If you smoked while driving or when feeling stressed, try other activities to replace smoking. Stand up when drinking your coffee. Brush your teeth after eating. Sit in a different chair when you read the paper. Avoid alcohol while trying to quit, and try to drink fewer caffeinated beverages. Alcohol and caffeine may urge you to  smoke.  Avoid foods and drinks that can trigger a desire to smoke, such as sugary or spicy foods and alcohol.  Ask people who smoke not to smoke around you.  Have something planned to do right after eating or having a cup of coffee. For example, plan to take a walk or exercise.  Try a relaxation exercise to calm you down and decrease your stress. Remember, you may be tense and nervous for the first 2 weeks after you quit, but this will pass.  Find new activities to keep your hands busy. Play with a pen, coin, or rubber band. Doodle or draw things on paper.  Brush your teeth right after eating. This will help cut down on the craving for the taste of tobacco after meals. You can also try mouthwash.   Use oral substitutes in place of cigarettes. Try using lemon drops, carrots, cinnamon sticks, or chewing gum. Keep them handy so they are available when you have the urge to smoke.  When you have the urge to smoke, try deep breathing.  Designate your home as a nonsmoking area.  If you are a heavy smoker, ask your health care provider about a prescription for nicotine chewing gum. It can ease your withdrawal from nicotine.  Reward yourself. Set aside the cigarette money you save and buy yourself something nice.  Look for support from others. Join a support group or smoking cessation program. Ask someone at home or at work to help you with your plan   to quit smoking.  Always ask yourself, "Do I need this cigarette or is this just a reflex?" Tell yourself, "Today, I choose not to smoke," or "I do not want to smoke." You are reminding yourself of your decision to quit.  Do not replace cigarette smoking with electronic cigarettes (commonly called e-cigarettes). The safety of e-cigarettes is unknown, and some may contain harmful chemicals.  If you relapse, do not give up! Plan ahead and think about what you will do the next time you get the urge to smoke. HOW WILL I FEEL WHEN I QUIT SMOKING? You  may have symptoms of withdrawal because your body is used to nicotine (the addictive substance in cigarettes). You may crave cigarettes, be irritable, feel very hungry, cough often, get headaches, or have difficulty concentrating. The withdrawal symptoms are only temporary. They are strongest when you first quit but will go away within 10-14 days. When withdrawal symptoms occur, stay in control. Think about your reasons for quitting. Remind yourself that these are signs that your body is healing and getting used to being without cigarettes. Remember that withdrawal symptoms are easier to treat than the major diseases that smoking can cause.  Even after the withdrawal is over, expect periodic urges to smoke. However, these cravings are generally short lived and will go away whether you smoke or not. Do not smoke! WHAT RESOURCES ARE AVAILABLE TO HELP ME QUIT SMOKING? Your health care provider can direct you to community resources or hospitals for support, which may include:  Group support.  Education.  Hypnosis.  Therapy.   This information is not intended to replace advice given to you by your health care provider. Make sure you discuss any questions you have with your health care provider.   Document Released: 02/15/2004 Document Revised: 06/09/2014 Document Reviewed: 11/04/2012 Elsevier Interactive Patient Education 2016 Elsevier Inc.  

## 2015-11-07 NOTE — Progress Notes (Signed)
Pre visit review using our clinic review tool, if applicable. No additional management support is needed unless otherwise documented below in the visit note. 

## 2016-03-06 ENCOUNTER — Other Ambulatory Visit: Payer: Self-pay | Admitting: Family Medicine

## 2016-06-26 ENCOUNTER — Other Ambulatory Visit: Payer: Self-pay | Admitting: Family Medicine

## 2016-11-05 ENCOUNTER — Other Ambulatory Visit: Payer: Self-pay | Admitting: Family Medicine

## 2016-11-28 ENCOUNTER — Other Ambulatory Visit: Payer: Self-pay | Admitting: Family Medicine

## 2017-01-02 ENCOUNTER — Other Ambulatory Visit: Payer: Self-pay | Admitting: Family Medicine

## 2017-01-30 ENCOUNTER — Other Ambulatory Visit: Payer: Self-pay | Admitting: Family Medicine

## 2017-02-19 ENCOUNTER — Encounter: Payer: Self-pay | Admitting: Family Medicine

## 2017-04-10 ENCOUNTER — Ambulatory Visit (INDEPENDENT_AMBULATORY_CARE_PROVIDER_SITE_OTHER): Payer: 59 | Admitting: Family Medicine

## 2017-04-10 ENCOUNTER — Encounter: Payer: Self-pay | Admitting: Family Medicine

## 2017-04-10 VITALS — BP 110/80 | HR 82 | Temp 98.4°F | Wt 238.5 lb

## 2017-04-10 DIAGNOSIS — Z23 Encounter for immunization: Secondary | ICD-10-CM | POA: Diagnosis not present

## 2017-04-10 DIAGNOSIS — J4520 Mild intermittent asthma, uncomplicated: Secondary | ICD-10-CM

## 2017-04-10 MED ORDER — PROVENTIL HFA 108 (90 BASE) MCG/ACT IN AERS: 1.0000 | INHALATION_SPRAY | RESPIRATORY_TRACT | 3 refills | Status: DC | PRN

## 2017-04-10 NOTE — Progress Notes (Signed)
Subjective:     Patient ID: Brett Morgan, male   DOB: 1970-11-04, 46 y.o.   MRN: [id number]  HPI Patient is a for follow-up regarding mild intermittent asthma. He uses albuterol inhaler and usually requires this about 2 or 3 hours per month. Denies any complications with asthma. He has unfortunately resumed smoking about half-pack cigarettes today. He runs but somewhat infrequently recently. No chest pains. No fever. He has quit smoking several times in the past. Recently tried Chantix again but did not seem to help much  Past Medical History:  Diagnosis Date  . ASTHMA 04/18/2009   Qualifier: Diagnosis of  By: Valma Cava LPN, Izora Gala    . HYPERLIPIDEMIA 06/06/2010   Qualifier: Diagnosis of  By: Elease Hashimoto MD, Mignon Bechler    . 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 Personal history of urinary calculi 04/18/2009   Qualifier: Diagnosis of  By: Valma Cava LPN, Izora Gala     Past Surgical History:  Procedure Laterality Date  . APPENDECTOMY    . KNEE SURGERY     arthroscopic left  . TONSILLECTOMY    . TONSILLECTOMY      reports that he has been smoking cigarettes and cigars.  He has a 25.00 pack-year smoking history. He has quit using smokeless tobacco. He reports that he drinks alcohol. He reports that he does not use drugs. family history includes Arthritis in his mother; Cancer in his other; Heart disease (age of onset: 62) in his father; Hypertension in his other; Stroke in his other. No Known Allergies   Review of Systems  Constitutional: Negative for chills and fever.  Respiratory: Negative for cough, shortness of breath and wheezing.   Cardiovascular: Negative for chest pain.       Objective:   Physical Exam  Constitutional: He appears well-developed and well-nourished.  Neck: Neck supple.  Cardiovascular: Normal rate and regular rhythm.  Pulmonary/Chest: Effort normal and breath sounds normal. No respiratory distress. He has no wheezes. He has no rales.  Lymphadenopathy:    He has no cervical  adenopathy.       Assessment:     Mild intermittent asthma. Ongoing nicotine use    Plan:     -Strongly advised to quit smoking -Refill Proventil for as needed use -Schedule complete physical -Flu vaccine given -We discussed if asthma severity worsens would need to look at controller medication options   Eulas Post MD [facility] at [facility]

## 2017-06-29 DIAGNOSIS — M5126 Other intervertebral disc displacement, lumbar region: Secondary | ICD-10-CM | POA: Diagnosis not present

## 2017-06-29 DIAGNOSIS — M5136 Other intervertebral disc degeneration, lumbar region: Secondary | ICD-10-CM | POA: Diagnosis not present

## 2017-06-29 DIAGNOSIS — Z9889 Other specified postprocedural states: Secondary | ICD-10-CM | POA: Insufficient documentation

## 2017-06-29 DIAGNOSIS — Z9089 Acquired absence of other organs: Secondary | ICD-10-CM | POA: Diagnosis not present

## 2017-07-06 DIAGNOSIS — M5136 Other intervertebral disc degeneration, lumbar region: Secondary | ICD-10-CM | POA: Diagnosis not present

## 2017-07-13 DIAGNOSIS — M48061 Spinal stenosis, lumbar region without neurogenic claudication: Secondary | ICD-10-CM | POA: Insufficient documentation

## 2017-07-13 DIAGNOSIS — M5126 Other intervertebral disc displacement, lumbar region: Secondary | ICD-10-CM | POA: Diagnosis not present

## 2017-07-13 DIAGNOSIS — M5136 Other intervertebral disc degeneration, lumbar region: Secondary | ICD-10-CM | POA: Diagnosis not present

## 2017-07-13 DIAGNOSIS — Z9089 Acquired absence of other organs: Secondary | ICD-10-CM | POA: Diagnosis not present

## 2017-07-23 DIAGNOSIS — M48061 Spinal stenosis, lumbar region without neurogenic claudication: Secondary | ICD-10-CM | POA: Diagnosis not present

## 2017-08-05 DIAGNOSIS — M5126 Other intervertebral disc displacement, lumbar region: Secondary | ICD-10-CM | POA: Diagnosis not present

## 2017-08-05 DIAGNOSIS — M48061 Spinal stenosis, lumbar region without neurogenic claudication: Secondary | ICD-10-CM | POA: Diagnosis not present

## 2017-08-05 DIAGNOSIS — M5136 Other intervertebral disc degeneration, lumbar region: Secondary | ICD-10-CM | POA: Diagnosis not present

## 2017-08-18 DIAGNOSIS — M48061 Spinal stenosis, lumbar region without neurogenic claudication: Secondary | ICD-10-CM | POA: Diagnosis not present

## 2017-09-01 DIAGNOSIS — M48061 Spinal stenosis, lumbar region without neurogenic claudication: Secondary | ICD-10-CM | POA: Diagnosis not present

## 2017-09-01 DIAGNOSIS — M5136 Other intervertebral disc degeneration, lumbar region: Secondary | ICD-10-CM | POA: Diagnosis not present

## 2017-09-01 DIAGNOSIS — Z9089 Acquired absence of other organs: Secondary | ICD-10-CM | POA: Diagnosis not present

## 2017-11-16 ENCOUNTER — Encounter: Payer: Self-pay | Admitting: Family Medicine

## 2017-11-16 ENCOUNTER — Ambulatory Visit (INDEPENDENT_AMBULATORY_CARE_PROVIDER_SITE_OTHER): Payer: 59 | Admitting: Family Medicine

## 2017-11-16 VITALS — BP 120/76 | HR 95 | Temp 98.2°F | Ht 70.5 in | Wt 230.0 lb

## 2017-11-16 DIAGNOSIS — Z0000 Encounter for general adult medical examination without abnormal findings: Secondary | ICD-10-CM

## 2017-11-16 MED ORDER — VARENICLINE TARTRATE 0.5 MG X 11 & 1 MG X 42 PO MISC: ORAL | 0 refills | Status: DC

## 2017-11-16 MED ORDER — VARENICLINE TARTRATE 1 MG PO TABS: 1.0000 mg | ORAL_TABLET | Freq: Two times a day (BID) | ORAL | 1 refills | Status: DC

## 2017-11-16 NOTE — Progress Notes (Signed)
Subjective:     Patient ID: Brett Morgan, male   DOB: 10-12-1970, 47 y.o.   MRN: [id number]  HPI Patient seen for physical exam. He has history of mild hyperlipidemia and history of kidney stones. He had some ongoing back difficulties and had seen orthopedics and had recent epidural injections which helped. Has returned to playing golf. He no longer runs. He is trying to lose about 20 more pounds as goal is to get down less than 210 pounds.  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 He is still smoking less than half pack cigarettes a day. He has had success with Chantix in the past and requesting refills.  Tetanus up-to-date  Reviewed with no changes:  Past Medical History:  Diagnosis Date  . ASTHMA 04/18/2009   Qualifier: Diagnosis of  By: Valma Cava LPN, Izora Gala    . HYPERLIPIDEMIA 06/06/2010   Qualifier: Diagnosis of  By: Elease Hashimoto MD, Britten Parady    . 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 Personal history of urinary calculi 04/18/2009   Qualifier: Diagnosis of  By: Valma Cava LPN, Izora Gala     Past Surgical History:  Procedure Laterality Date  . APPENDECTOMY    . KNEE SURGERY     arthroscopic left  . LAPAROSCOPIC APPENDECTOMY N/A 02/16/2013   Procedure: APPENDECTOMY LAPAROSCOPIC;  Surgeon: Leighton Ruff, MD;  Location: [facility];  Service: General;  Laterality: N/A;  . LUMBAR LAMINECTOMY/DECOMPRESSION MICRODISCECTOMY  10/16/2011   Procedure: LUMBAR LAMINECTOMY/DECOMPRESSION MICRODISCECTOMY;  Surgeon: Johnn Hai, MD;  Location: [facility];  Service: Orthopedics;  Laterality: Right;  Micro Lumbar Decompression L4-L5 on Right  . TONSILLECTOMY    . TONSILLECTOMY      reports that he has been smoking cigarettes and cigars.  He has a 25.00 pack-year smoking history. He has quit using smokeless tobacco. He reports that he drinks alcohol. He reports that he does not use drugs. family history includes Arthritis in his mother; Cancer in his other; Heart disease (age of  onset: 43) in his father; Hypertension in his other; Stroke in his other. No Known Allergies   Review of Systems  Constitutional: Negative for activity change, appetite change, fatigue and fever.  HENT: Negative for congestion, ear pain and trouble swallowing.   Eyes: Negative for pain and visual disturbance.  Respiratory: Negative for cough, chest tightness, shortness of breath and wheezing.   Cardiovascular: Negative for chest pain, palpitations and leg swelling.  Gastrointestinal: Negative for abdominal distention, abdominal pain, blood in stool, constipation, diarrhea, nausea, rectal pain and vomiting.  Endocrine: Negative for polydipsia and polyuria.  Genitourinary: Negative for dysuria, hematuria and testicular pain.  Musculoskeletal: Negative for arthralgias and joint swelling.  Skin: Negative for rash.  Neurological: Negative for dizziness, syncope, weakness, light-headedness and headaches.  Hematological: Negative for adenopathy.  Psychiatric/Behavioral: Negative for confusion and dysphoric mood.       Objective:   Physical Exam  Constitutional: He is oriented to person, place, and time. He appears well-developed and well-nourished. No distress.  HENT:  Head: Normocephalic and atraumatic.  Right Ear: External ear normal.  Left Ear: External ear normal.  Mouth/Throat: Oropharynx is clear and moist.  Eyes: Pupils are equal, round, and reactive to light. Conjunctivae and EOM are normal.  Neck: Normal range of motion. Neck supple. No thyromegaly present.  Cardiovascular: Normal rate, regular rhythm and normal heart sounds.  No murmur heard. Pulmonary/Chest: No respiratory distress. He has no wheezes. He  has no rales.  Abdominal: Soft. Bowel sounds are normal. He exhibits no distension and no mass. There is no tenderness. There is no rebound and no guarding.  Musculoskeletal: He exhibits no edema.  Lymphadenopathy:    He has no cervical adenopathy.  Neurological: He is alert  and oriented to person, place, and time. He displays normal reflexes. No cranial nerve deficit.  Skin: No rash noted.  Psychiatric: He has a normal mood and affect.       Assessment:     Physical exam. The following issues were addressed    Plan:     -He will return for fasting labs -Smoking cessation discussed. Prescription for Chantix starter pack and continuing pack. Treatment for total of 3 months -Continue with yearly flu vaccine  Eulas Post MD [facility] at [facility]

## 2017-11-17 ENCOUNTER — Encounter: Payer: Self-pay | Admitting: Family Medicine

## 2017-11-17 LAB — BASIC METABOLIC PANEL
BUN: 16 mg/dL (ref 6–23)
CO2: 28 mEq/L (ref 19–32)
Calcium: 9.2 mg/dL (ref 8.4–10.5)
Chloride: 105 mEq/L (ref 96–112)
Creatinine, Ser: 0.87 mg/dL (ref 0.40–1.50)
GFR: 100.48 mL/min (ref >60.00)
GLUCOSE: 99 mg/dL (ref 70–99)
POTASSIUM: 4.5 meq/L (ref 3.5–5.1)
Sodium: 141 mEq/L (ref 135–145)

## 2017-11-17 LAB — HEPATIC FUNCTION PANEL
ALBUMIN: 4 g/dL (ref 3.5–5.2)
ALT: 24 U/L (ref 0–53)
AST: 20 U/L (ref 0–37)
Alkaline Phosphatase: 76 U/L (ref 39–117)
Bilirubin, Direct: 0.1 mg/dL (ref 0.0–0.3)
TOTAL PROTEIN: 6.4 g/dL (ref 6.0–8.3)
Total Bilirubin: 0.3 mg/dL (ref 0.2–1.2)

## 2017-11-17 LAB — CBC WITH DIFFERENTIAL/PLATELET
BASOS PCT: 0.5 % (ref 0.0–3.0)
Basophils Absolute: 0 10*3/uL (ref 0.0–0.1)
EOS ABS: 0.3 10*3/uL (ref 0.0–0.7)
Eosinophils Relative: 3.9 % (ref 0.0–5.0)
HCT: 40.5 % (ref 39.0–52.0)
HEMOGLOBIN: 14 g/dL (ref 13.0–17.0)
Lymphocytes Relative: 31.1 % (ref 12.0–46.0)
Lymphs Abs: 2.7 10*3/uL (ref 0.7–4.0)
MCHC: 34.5 g/dL (ref 30.0–36.0)
MCV: 93.5 fl (ref 78.0–100.0)
MONO ABS: 0.6 10*3/uL (ref 0.1–1.0)
Monocytes Relative: 6.6 % (ref 3.0–12.0)
Neutro Abs: 5.1 10*3/uL (ref 1.4–7.7)
Neutrophils Relative %: 57.9 % (ref 43.0–77.0)
Platelets: 205 10*3/uL (ref 150.0–400.0)
RBC: 4.33 Mil/uL (ref 4.22–5.81)
RDW: 13.4 % (ref 11.5–15.5)
WBC: 8.8 10*3/uL (ref 4.0–10.5)

## 2017-11-17 LAB — LIPID PANEL
CHOLESTEROL: 187 mg/dL (ref 0–200)
HDL: 35.3 mg/dL — AB (ref >39.00)
LDL CALC: 134 mg/dL — AB (ref 0–99)
NonHDL: 151.66
Total CHOL/HDL Ratio: 5
Triglycerides: 90 mg/dL (ref 0.0–149.0)
VLDL: 18 mg/dL (ref 0.0–40.0)

## 2017-11-17 LAB — TSH: TSH: 1.49 u[IU]/mL (ref 0.35–4.50)

## 2017-11-17 NOTE — Addendum Note (Signed)
Addended by: Tomi Likens on: 11/17/2017 07:49 AM   Modules accepted: Orders

## 2017-11-19 ENCOUNTER — Telehealth: Payer: Self-pay | Admitting: Family Medicine

## 2017-11-19 NOTE — Telephone Encounter (Signed)
Copied from [place] [phone]. Topic: Quick Communication - See Telephone Encounter >> Nov 19, 2017  1:38 PM Yvette Rack wrote: Pt calling back stating that the pharmacy had just refaxed the request for medicine Chantex

## 2017-11-19 NOTE — Telephone Encounter (Signed)
Copied from [place] [phone]. Topic: Quick Communication - See Telephone Encounter >> Nov 19, 2017  1:24 PM Robina Ade, Helene Kelp D wrote: CRM for notification. See Telephone encounter for: 11/19/17. Patient called and would like to know the status of the prior authorization for his medication Chantix. Please call patient back, thanks

## 2017-11-20 NOTE — Telephone Encounter (Signed)
PA for Chantix Starter Pack initiated via CoverMyMeds. Awaiting determination. Key: [id number]  Left message to notify patient.

## 2017-11-23 NOTE — Telephone Encounter (Addendum)
PA is approved through 11/21/2018. Left message notifying patient.

## 2017-12-11 ENCOUNTER — Encounter: Payer: Self-pay | Admitting: Family

## 2017-12-11 ENCOUNTER — Ambulatory Visit: Payer: Self-pay | Admitting: Family

## 2017-12-11 VITALS — BP 144/86 | HR 86 | Temp 98.3°F | Resp 20 | Wt 244.2 lb

## 2017-12-11 DIAGNOSIS — B9689 Other specified bacterial agents as the cause of diseases classified elsewhere: Secondary | ICD-10-CM

## 2017-12-11 DIAGNOSIS — J019 Acute sinusitis, unspecified: Secondary | ICD-10-CM

## 2017-12-11 DIAGNOSIS — J029 Acute pharyngitis, unspecified: Secondary | ICD-10-CM

## 2017-12-11 MED ORDER — FLUTICASONE PROPIONATE 50 MCG/ACT NA SUSP: 2.0000 | Freq: Every day | NASAL | 6 refills | Status: DC

## 2017-12-11 MED ORDER — AZITHROMYCIN 250 MG PO TABS: ORAL_TABLET | ORAL | 0 refills | Status: DC

## 2017-12-11 NOTE — Patient Instructions (Signed)

## 2017-12-11 NOTE — Progress Notes (Signed)
Subjective:     Patient ID: Brett Morgan, male   DOB: September 11, 1970, 47 y.o.   MRN: [id number]  HPI 47 year old male is in today with c/o sinus pressure, cough, congestion, sore throat x 1 week. Has not been taking any medication. Smokes 1/2 ppd Review of Systems  Constitutional: Positive for chills, fatigue and fever.  HENT: Positive for postnasal drip, sinus pressure and sinus pain.   Respiratory: Positive for cough. Negative for shortness of breath and stridor.   Cardiovascular: Negative.   Endocrine: Negative.   Musculoskeletal: Negative.   Allergic/Immunologic: Negative.   Neurological: Negative.   Psychiatric/Behavioral: Negative.    Past Medical History:  Diagnosis Date  . ASTHMA 04/18/2009   Qualifier: Diagnosis of  By: Valma Cava LPN, Izora Gala    . HYPERLIPIDEMIA 06/06/2010   Qualifier: Diagnosis of  By: Elease Hashimoto MD, [name]    . 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 Personal history of urinary calculi 04/18/2009   Qualifier: Diagnosis of  By: Valma Cava LPN, Izora Gala      Social History   Socioeconomic History  . Marital status: Single    Spouse name: Not on file  . Number of children: Not on file  . Years of education: Not on file  . Highest education level: Not on file  Occupational History  . Not on file  Social Needs  . Financial resource strain: Not on file  . Food insecurity:    Worry: Not on file    Inability: Not on file  . Transportation needs:    Medical: Not on file    Non-medical: Not on file  Tobacco Use  . Smoking status: Current Every Day Smoker    Packs/day: 1.00    Years: 25.00    Pack years: 25.00    Types: Cigarettes, Cigars    Last attempt to quit: 08/04/2012    Years since quitting: 5.3  . Smokeless tobacco: Former Systems developer  . Tobacco comment: using patch and zyban -trying to quit  Substance and Sexual Activity  . Alcohol use: Yes    Comment: rarely  . Drug use: No  . Sexual activity: Not on file  Lifestyle  . Physical activity:    Days per week:  Not on file    Minutes per session: Not on file  . Stress: Not on file  Relationships  . Social connections:    Talks on phone: Not on file    Gets together: Not on file    Attends religious service: Not on file    Active member of club or organization: Not on file    Attends meetings of clubs or organizations: Not on file    Relationship status: Not on file  . Intimate partner violence:    Fear of current or ex partner: Not on file    Emotionally abused: Not on file    Physically abused: Not on file    Forced sexual activity: Not on file  Other Topics Concern  . Not on file  Social History Narrative  . Not on file    Past Surgical History:  Procedure Laterality Date  . APPENDECTOMY    . KNEE SURGERY     arthroscopic left  . LAPAROSCOPIC APPENDECTOMY N/A 02/16/2013   Procedure: APPENDECTOMY LAPAROSCOPIC;  Surgeon: Leighton Ruff, MD;  Location: [facility];  Service: General;  Laterality: N/A;  . LUMBAR LAMINECTOMY/DECOMPRESSION MICRODISCECTOMY  10/16/2011   Procedure: LUMBAR LAMINECTOMY/DECOMPRESSION MICRODISCECTOMY;  Surgeon: Johnn Hai, MD;  Location: [facility];  Service: Orthopedics;  Laterality: Right;  Micro Lumbar Decompression L4-L5 on Right  . TONSILLECTOMY    . TONSILLECTOMY      Family History  Problem Relation Age of Onset  . Heart disease Father 68       CAD  . Hypertension Other   . Cancer Other        grandparent, lung  . Stroke Other        grandparent  . Arthritis Mother     No Known Allergies  Current Outpatient Medications on File Prior to Visit  Medication Sig Dispense Refill  . aspirin EC 81 MG tablet Take 81 mg by mouth 2 (two) times daily.    . Multiple Vitamin (MULITIVITAMIN WITH MINERALS) TABS Take 1 tablet by mouth daily.    [name] Kitchen Plant Sterol Stanol-Pantethine (CHOLEST OFF COMPLETE) 450-75 MG TABS Take 2 tablets by mouth 2 (two) times daily.     [name] Kitchen PROVENTIL HFA 108 (90 Base) MCG/ACT inhaler Inhale 1-2 puffs every 4 (four) hours as needed into the  lungs for wheezing or shortness of breath. 1 Inhaler 3  . varenicline (CHANTIX CONTINUING MONTH PAK) 1 MG tablet Take 1 tablet (1 mg total) by mouth 2 (two) times daily. 60 tablet 1  . varenicline (CHANTIX STARTING MONTH PAK) 0.5 MG X 11 & 1 MG X 42 tablet Take one 0.5 mg tablet by mouth once daily for 3 days, then increase to one 0.5 mg tablet twice daily for 4 days, then increase to one 1 mg tablet twice daily. (Patient not taking: Reported on 12/11/2017) 53 tablet 0   No current facility-administered medications on file prior to visit.     BP (!) 144/86 (BP Location: Right Arm, Patient Position: Sitting, Cuff Size: Normal)   Pulse 86   Temp 98.3 F (36.8 C) (Oral)   Resp 20   Wt 244 lb 3.2 oz (110.8 kg)   SpO2 98%   BMI 34.54 kg/m chart    Objective:   Physical Exam  Constitutional: He is oriented to person, place, and time. He appears well-developed and well-nourished.  HENT:  Right Ear: External ear normal.  Left Ear: External ear normal.  Mouth/Throat: Oropharynx is clear and moist.  sinus tenderness to palpation maxillary  Neck: Normal range of motion. Neck supple.  Cardiovascular: Normal rate, regular rhythm and normal heart sounds.  Pulmonary/Chest: Effort normal and breath sounds normal.  Musculoskeletal: Normal range of motion.  Neurological: He is alert and oriented to person, place, and time.  Skin: Skin is warm and dry.  Psychiatric: He has a normal mood and affect.       Assessment:     Diagnoses and all orders for this visit:  Sore throat -     POCT rapid strep A  Acute bacterial sinusitis  Other orders -     fluticasone (FLONASE) 50 MCG/ACT nasal spray; Place 2 sprays into both nostrils daily. -     azithromycin (ZITHROMAX) 250 MG tablet; 2 tabs today then 1 tab daily x 4 more days      Plan:     Call the office with any questions or concerns. Encouraged smoking cessation.

## 2018-04-27 ENCOUNTER — Other Ambulatory Visit: Payer: Self-pay | Admitting: Family Medicine

## 2018-07-14 ENCOUNTER — Other Ambulatory Visit: Payer: Self-pay | Admitting: Family Medicine

## 2018-07-20 ENCOUNTER — Ambulatory Visit: Payer: 59 | Admitting: Family Medicine

## 2018-07-20 ENCOUNTER — Encounter: Payer: Self-pay | Admitting: Family Medicine

## 2018-07-20 ENCOUNTER — Other Ambulatory Visit: Payer: Self-pay

## 2018-07-20 VITALS — BP 120/78 | HR 85 | Temp 97.8°F | Ht 70.5 in | Wt 243.6 lb

## 2018-07-20 DIAGNOSIS — J069 Acute upper respiratory infection, unspecified: Secondary | ICD-10-CM

## 2018-07-20 DIAGNOSIS — J029 Acute pharyngitis, unspecified: Secondary | ICD-10-CM

## 2018-07-20 LAB — POCT RAPID STREP A (OFFICE): Rapid Strep A Screen: NEGATIVE

## 2018-07-20 NOTE — Progress Notes (Signed)
  Subjective:     Patient ID: Brett Morgan, male   DOB: March 20, 1971, 48 y.o.   MRN: [id number]  HPI Patient is seen with chief complaint of sore throat.  He actually developed some nasal congestive symptoms around Saturday.  Sore throat 1 day duration.  Took some DayQuil and NyQuil over the weekend.  Mild body aches.  No fever.  No cough.  Was concerned about possible strep though no specific exposures.  He has had strep in the past.  Past Medical History:  Diagnosis Date  . ASTHMA 04/18/2009   Qualifier: Diagnosis of  By: Valma Cava LPN, Izora Gala    . HYPERLIPIDEMIA 06/06/2010   Qualifier: Diagnosis of  By: Elease Hashimoto MD, Camari Quintanilla    . 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 Personal history of urinary calculi 04/18/2009   Qualifier: Diagnosis of  By: Valma Cava LPN, Izora Gala     Past Surgical History:  Procedure Laterality Date  . APPENDECTOMY    . KNEE SURGERY     arthroscopic left  . LAPAROSCOPIC APPENDECTOMY N/A 02/16/2013   Procedure: APPENDECTOMY LAPAROSCOPIC;  Surgeon: Leighton Ruff, MD;  Location: [facility];  Service: General;  Laterality: N/A;  . LUMBAR LAMINECTOMY/DECOMPRESSION MICRODISCECTOMY  10/16/2011   Procedure: LUMBAR LAMINECTOMY/DECOMPRESSION MICRODISCECTOMY;  Surgeon: Johnn Hai, MD;  Location: [facility];  Service: Orthopedics;  Laterality: Right;  Micro Lumbar Decompression L4-L5 on Right  . TONSILLECTOMY    . TONSILLECTOMY      reports that he has been smoking cigarettes and cigars. He has a 25.00 pack-year smoking history. He has quit using smokeless tobacco. He reports current alcohol use. He reports that he does not use drugs. family history includes Arthritis in his mother; Cancer in an other family member; Heart disease (age of onset: 33) in his father; Hypertension in an other family member; Stroke in an other family member. No Known Allergies   Review of Systems  Constitutional: Negative for chills and fever.  HENT: Positive for congestion and sore throat. Negative for sinus  pressure and sinus pain.   Respiratory: Negative for cough.        Objective:   Physical Exam Constitutional:      General: He is not in acute distress.    Appearance: He is well-developed. He is not toxic-appearing.  HENT:     Right Ear: Tympanic membrane normal.     Left Ear: Tympanic membrane normal.     Mouth/Throat:     Pharynx: Oropharynx is clear.     Tonsils: No tonsillar exudate.     Comments: Mild erythema.  No exudate. Neck:     Musculoskeletal: Neck supple.  Cardiovascular:     Rate and Rhythm: Normal rate and regular rhythm.  Pulmonary:     Effort: Pulmonary effort is normal.     Breath sounds: Normal breath sounds. No wheezing or rales.  Lymphadenopathy:     Cervical: No cervical adenopathy.  Neurological:     Mental Status: He is alert.        Assessment:     Sore throat.  Suspect viral but rule out strep    Plan:     -Check rapid strep screen= negative -plenty of fluids and rest -OTC NSAIDS or Tylenol as needed. -follow up prn  Eulas Post MD [facility]

## 2018-07-20 NOTE — Patient Instructions (Signed)
Sinusitis, Adult  Sinusitis is inflammation of your sinuses. Sinuses are hollow spaces in the bones around your face. Your sinuses are located:   Around your eyes.   In the middle of your forehead.   Behind your nose.   In your cheekbones.  Mucus normally drains out of your sinuses. When your nasal tissues become inflamed or swollen, mucus can become trapped or blocked. This allows bacteria, viruses, and fungi to grow, which leads to infection. Most infections of the sinuses are caused by a virus.  Sinusitis can develop quickly. It can last for up to 4 weeks (acute) or for more than 12 weeks (chronic). Sinusitis often develops after a cold.  What are the causes?  This condition is caused by anything that creates swelling in the sinuses or stops mucus from draining. This includes:   Allergies.   Asthma.   Infection from bacteria or viruses.   Deformities or blockages in your nose or sinuses.   Abnormal growths in the nose (nasal polyps).   Pollutants, such as chemicals or irritants in the air.   Infection from fungi (rare).  What increases the risk?  You are more likely to develop this condition if you:   Have a weak body defense system (immune system).   Do a lot of swimming or diving.   Overuse nasal sprays.   Smoke.  What are the signs or symptoms?  The main symptoms of this condition are pain and a feeling of pressure around the affected sinuses. Other symptoms include:   Stuffy nose or congestion.   Thick drainage from your nose.   Swelling and warmth over the affected sinuses.   Headache.   Upper toothache.   A cough that may get worse at night.   Extra mucus that collects in the throat or the back of the nose (postnasal drip).   Decreased sense of smell and taste.   Fatigue.   A fever.   Sore throat.   Bad breath.  How is this diagnosed?  This condition is diagnosed based on:   Your symptoms.   Your medical history.   A physical exam.   Tests to find out if your condition is  acute or chronic. This may include:  ? Checking your nose for nasal polyps.  ? Viewing your sinuses using a device that has a light (endoscope).  ? Testing for allergies or bacteria.  ? Imaging tests, such as an MRI or CT scan.  In rare cases, a bone biopsy may be done to rule out more serious types of fungal sinus disease.  How is this treated?  Treatment for sinusitis depends on the cause and whether your condition is chronic or acute.   If caused by a virus, your symptoms should go away on their own within 10 days. You may be given medicines to relieve symptoms. They include:  ? Medicines that shrink swollen nasal passages (topical intranasal decongestants).  ? Medicines that treat allergies (antihistamines).  ? A spray that eases inflammation of the nostrils (topical intranasal corticosteroids).  ? Rinses that help get rid of thick mucus in your nose (nasal saline washes).   If caused by bacteria, your health care provider may recommend waiting to see if your symptoms improve. Most bacterial infections will get better without antibiotic medicine. You may be given antibiotics if you have:  ? A severe infection.  ? A weak immune system.   If caused by narrow nasal passages or nasal polyps, you may need   to have surgery.  Follow these instructions at home:  Medicines   Take, use, or apply over-the-counter and prescription medicines only as told by your health care provider. These may include nasal sprays.   If you were prescribed an antibiotic medicine, take it as told by your health care provider. Do not stop taking the antibiotic even if you start to feel better.  Hydrate and humidify     Drink enough fluid to keep your urine pale yellow. Staying hydrated will help to thin your mucus.   Use a cool mist humidifier to keep the humidity level in your home above 50%.   Inhale steam for 10-15 minutes, 3-4 times a day, or as told by your health care provider. You can do this in the bathroom while a hot shower is  running.   Limit your exposure to cool or dry air.  Rest   Rest as much as possible.   Sleep with your head raised (elevated).   Make sure you get enough sleep each night.  General instructions     Apply a warm, moist washcloth to your face 3-4 times a day or as told by your health care provider. This will help with discomfort.   Wash your hands often with soap and water to reduce your exposure to germs. If soap and water are not available, use hand sanitizer.   Do not smoke. Avoid being around people who are smoking (secondhand smoke).   Keep all follow-up visits as told by your health care provider. This is important.  Contact a health care provider if:   You have a fever.   Your symptoms get worse.   Your symptoms do not improve within 10 days.  Get help right away if:   You have a severe headache.   You have persistent vomiting.   You have severe pain or swelling around your face or eyes.   You have vision problems.   You develop confusion.   Your neck is stiff.   You have trouble breathing.  Summary   Sinusitis is soreness and inflammation of your sinuses. Sinuses are hollow spaces in the bones around your face.   This condition is caused by nasal tissues that become inflamed or swollen. The swelling traps or blocks the flow of mucus. This allows bacteria, viruses, and fungi to grow, which leads to infection.   If you were prescribed an antibiotic medicine, take it as told by your health care provider. Do not stop taking the antibiotic even if you start to feel better.   Keep all follow-up visits as told by your health care provider. This is important.  This information is not intended to replace advice given to you by your health care provider. Make sure you discuss any questions you have with your health care provider.  Document Released: 05/19/2005 Document Revised: 10/19/2017 Document Reviewed: 10/19/2017  Elsevier Interactive Patient Education  2019 [employer] Inc[employer]

## 2018-10-15 ENCOUNTER — Other Ambulatory Visit: Payer: Self-pay | Admitting: Family Medicine

## 2018-11-20 ENCOUNTER — Telehealth: Payer: 59 | Admitting: Nurse Practitioner

## 2018-11-20 DIAGNOSIS — R05 Cough: Secondary | ICD-10-CM

## 2018-11-20 DIAGNOSIS — R197 Diarrhea, unspecified: Secondary | ICD-10-CM

## 2018-11-20 DIAGNOSIS — R059 Cough, unspecified: Secondary | ICD-10-CM

## 2018-11-20 NOTE — Progress Notes (Signed)
E-Visit for Corona Virus Screening   Based on your current symptoms, it seems unlikely that your symptoms are related to the Coronavirus.     COVID-19 is a respiratory illness with symptoms that are similar to the flu. Symptoms are typically mild to moderate, but there have been cases of severe illness and death due to the virus. The following symptoms may appear 2-14 days after exposure: . Fever . Cough . Shortness of breath or difficulty breathing . Chills . Repeated shaking with chills . Muscle pain . Headache . Sore throat . New loss of taste or smell . Fatigue . Congestion or runny nose . Nausea or vomiting . Diarrhea  It is vitally important that if you feel that you have an infection such as this virus or any other virus that you stay home and away from places where you may spread it to others.  You should self-quarantine for 14 days if you have symptoms that could potentially be coronavirus or have been in close contact a with a person diagnosed with COVID-19 within the last 2 weeks. You should avoid contact with people age 24 and older.   You should wear a mask or cloth face covering over your nose and mouth if you must be around other people or animals, including pets (even at home). Try to stay at least 6 feet away from other people. This will protect the people around you.  You can use medication such as delsym or mucinex OTC if develop a cough  You may also take acetaminophen (Tylenol) as needed for fever.   Reduce your risk of any infection by using the same precautions used for avoiding the common cold or flu:  [name] Kitchen Wash your hands often with soap and warm water for at least 20 seconds.  If soap and water are not readily available, use an alcohol-based hand sanitizer with at least 60% alcohol.  . If coughing or sneezing, cover your mouth and nose by coughing or sneezing into the elbow areas of your shirt or coat, into a tissue or into your sleeve (not your hands). . Avoid  shaking hands with others and consider head nods or verbal greetings only. . Avoid touching your eyes, nose, or mouth with unwashed hands.  . Avoid close contact with people who are sick. . Avoid places or events with large numbers of people in one location, like concerts or sporting events. . Carefully consider travel plans you have or are making. . If you are planning any travel outside or inside the [place], visit the CDC's Travelers' Health webpage for the latest health notices. . If you have some symptoms but not all symptoms, continue to monitor at home and seek medical attention if your symptoms worsen. . If you are having a medical emergency, call 911.  HOME CARE . Only take medications as instructed by your medical team. . Drink plenty of fluids and get plenty of rest. . A steam or ultrasonic humidifier can help if you have congestion.   GET HELP RIGHT AWAY IF YOU HAVE EMERGENCY WARNING SIGNS** FOR COVID-19. If you or someone is showing any of these signs seek emergency medical care immediately. Call 911 or proceed to your closest emergency facility if: . You develop worsening high fever. . Trouble breathing . Bluish lips or face . Persistent pain or pressure in the chest . New confusion . Inability to wake or stay awake . You cough up blood. . Your symptoms become more severe  **This list  is not all possible symptoms. Contact your medical provider for any symptoms that are sever or concerning to you.   MAKE SURE YOU   Understand these instructions.  Will watch your condition.  Will get help right away if you are not doing well or get worse.  Your e-visit answers were reviewed by a board certified advanced clinical practitioner to complete your personal care plan.  Depending on the condition, your plan could have included both over the counter or prescription medications.  If there is a problem please reply once you have received a response from your provider.  Your safety is  important to [place].  If you have drug allergies check your prescription carefully.    You can use MyChart to ask questions about today's visit, request a non-urgent call back, or ask for a work or school excuse for 24 hours related to this e-Visit. If it has been greater than 24 hours you will need to follow up with your provider, or enter a new e-Visit to address those concerns. You will get an e-mail in the next two days asking about your experience.  I hope that your e-visit has been valuable and will speed your recovery. Thank you for using e-visits.   5-10 minutes spent reviewing and documenting in chart.

## 2018-11-22 ENCOUNTER — Ambulatory Visit: Payer: Self-pay | Admitting: Family Medicine

## 2018-11-22 ENCOUNTER — Ambulatory Visit (INDEPENDENT_AMBULATORY_CARE_PROVIDER_SITE_OTHER): Payer: 59 | Admitting: Family Medicine

## 2018-11-22 ENCOUNTER — Other Ambulatory Visit: Payer: Self-pay

## 2018-11-22 ENCOUNTER — Telehealth: Payer: Self-pay | Admitting: *Deleted

## 2018-11-22 DIAGNOSIS — R05 Cough: Secondary | ICD-10-CM | POA: Diagnosis not present

## 2018-11-22 DIAGNOSIS — R059 Cough, unspecified: Secondary | ICD-10-CM

## 2018-11-22 DIAGNOSIS — Z20822 Contact with and (suspected) exposure to covid-19: Secondary | ICD-10-CM

## 2018-11-22 NOTE — Progress Notes (Signed)
Patient ID: Brett Morgan, male   DOB: 1971-04-02, 48 y.o.   MRN: [id number]   This visit type was conducted due to national recommendations for restrictions regarding the COVID-19 pandemic in an effort to limit this patient's exposure and mitigate transmission in our community.   Virtual Visit via Telephone Note  I connected with Brett Morgan on 11/22/18 at 11:45 AM EDT by telephone and verified that I am speaking with the correct person using two identifiers.   I discussed the limitations, risks, security and privacy concerns of performing an evaluation and management service by telephone and the availability of in person appointments. I also discussed with the patient that there may be a patient responsible charge related to this service. The patient expressed understanding and agreed to proceed.  Location patient: home Location provider: work or home office Participants present for the call: patient, provider Patient did not have a visit in the prior 7 days to address this/these issue(s).   History of Present Illness: Patient developed symptoms over the weekend of some diarrhea and cough.  He did e- visit on Saturday.  His diarrhea is improved and is keeping down fluids with no nausea or vomiting.  His cough is relatively mild.  He had extreme fatigue and malaise.  No definite fever.  No dyspnea.  Denies any sore throat.  No loss of taste or smell.  He has not had any specific sick contacts.   One of his concerns is that his parents live with him and he is concerned about their risk.   Observations/Objective: Patient sounds cheerful and well on the phone. I do not appreciate any SOB. Speech and thought processing are grossly intact. Patient reported vitals:  Assessment and Plan:  Patient relates 2-day history of cough, malaise, diarrhea.  Diarrhea symptoms improving.  Follow Up Instructions:  -We will refer for COVID screening -In the meantime he knows to stay isolated from  coworkers and his parents -Corliss Blacker of fluids and rest and follow-up promptly for any shortness of breath or other concerns   [id number] [id number] [id number] [id number] 21-30 I did not refer this patient for an OV in the next 24 hours for this/these issue(s).  I discussed the assessment and treatment plan with the patient. The patient was provided an opportunity to ask questions and all were answered. The patient agreed with the plan and demonstrated an understanding of the instructions.   The patient was advised to call back or seek an in-person evaluation if the symptoms worsen or if the condition fails to improve as anticipated.  I provided 13 minutes of non-face-to-face time during this encounter.   Carolann Littler, MD

## 2018-11-22 NOTE — Telephone Encounter (Signed)
Pt has been scheduled with PCP for virtual visit.

## 2018-11-22 NOTE — Telephone Encounter (Signed)
Pt. Started feeling bad Saturday. Had an E-visit and was told to follow up with PCP. Having diarrhea, chills, fatigue. Concerned this could be COVID 19. Has asthma.Warm transfer to Tammy in the practice.  Answer Assessment - Initial Assessment Questions 1. COVID-19 DIAGNOSIS: "Who made your Coronavirus (COVID-19) diagnosis?" "Was it confirmed by a positive lab test?" If not diagnosed by a HCP, ask "Are there lots of cases (community spread) where you live?" (See public health department website, if unsure)     No test 2. ONSET: "When did the COVID-19 symptoms start?"      Saturday 3. WORST SYMPTOM: "What is your worst symptom?" (e.g., cough, fever, shortness of breath, muscle aches)     Diarrhea 4. COUGH: "Do you have a cough?" If so, ask: "How bad is the cough?"       Mild 5. FEVER: "Do you have a fever?" If so, ask: "What is your temperature, how was it measured, and when did it start?"     No 6. RESPIRATORY STATUS: "Describe your breathing?" (e.g., shortness of breath, wheezing, unable to speak)      Wheezing 7. BETTER-SAME-WORSE: "Are you getting better, staying the same or getting worse compared to yesterday?"  If getting worse, ask, "In what way?"     Same 8. HIGH RISK DISEASE: "Do you have any chronic medical problems?" (e.g., asthma, heart or lung disease, weak immune system, etc.)     Asthma 9. PREGNANCY: "Is there any chance you are pregnant?" "When was your last menstrual period?"     N/a 10. OTHER SYMPTOMS: "Do you have any other symptoms?"  (e.g., chills, fatigue, headache, loss of smell or taste, muscle pain, sore throat)       Body aches  Protocols used: CORONAVIRUS (COVID-19) DIAGNOSED OR SUSPECTED-A-AH

## 2018-11-22 NOTE — Telephone Encounter (Signed)
-----   Message from Anibal Henderson, [place] sent at 11/22/2018 12:18 PM EDT ----- Regarding: COVID 19 Test

## 2018-11-22 NOTE — Telephone Encounter (Signed)
Patient scheduled for covid testing today @ [facility]. Instructions given and order placed.

## 2018-11-25 LAB — NOVEL CORONAVIRUS, NAA: SARS-CoV-2, NAA: NOT DETECTED

## 2019-01-10 ENCOUNTER — Telehealth: Payer: Self-pay

## 2019-01-10 NOTE — Telephone Encounter (Signed)
Called patient and left a voice message to try to schedule patient for an appointment. I sent Dr. Elease Hashimoto a message to see if he wanted in office or virtual depending on symptoms.

## 2019-01-11 ENCOUNTER — Other Ambulatory Visit: Payer: Self-pay

## 2019-01-11 ENCOUNTER — Telehealth: Payer: 59 | Admitting: Family Medicine

## 2019-01-11 DIAGNOSIS — R590 Localized enlarged lymph nodes: Secondary | ICD-10-CM | POA: Diagnosis not present

## 2019-01-11 NOTE — Patient Instructions (Signed)
Continue with the Advil and consider ice 2-3 times daily  Let me know if swelling not improved in one.

## 2019-01-11 NOTE — Progress Notes (Signed)
This visit type was conducted due to national recommendations for restrictions regarding the COVID-19 pandemic in an effort to limit this patient's exposure and mitigate transmission in our community.   Virtual Visit via Video Note  I connected with [name] on 01/11/19 at 10:00 AM EDT by a video enabled telemedicine application and verified that I am speaking with the correct person using two identifiers.  Location patient: home Location provider:work or home office Persons participating in the virtual visit: patient, provider  I discussed the limitations of evaluation and management by telemedicine and the availability of in person appointments. The patient expressed understanding and agreed to proceed.   HPI: Patient seen with onset about a week ago of some soreness and mild swelling right anterior cervical triangle region.  He has not had any fever or chills.  No sore throat.  No earache.  He has not noticed any redness or pus in his tonsillar region.  No other adenopathy noted.  Generally feels well.  He has taken some Tylenol and Advil with minimal relief.  No sick contacts.  No cough.  No dyspnea.   ROS: See pertinent positives and negatives per HPI.  Past Medical History:  Diagnosis Date  . ASTHMA 04/18/2009   Qualifier: Diagnosis of  By: Valma Cava LPN, Izora Gala    . HYPERLIPIDEMIA 06/06/2010   Qualifier: Diagnosis of  By: Elease Hashimoto MD, Ilyanna Baillargeon    . 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 Personal history of urinary calculi 04/18/2009   Qualifier: Diagnosis of  By: Valma Cava LPN, Izora Gala      Past Surgical History:  Procedure Laterality Date  . APPENDECTOMY    . KNEE SURGERY     arthroscopic left  . LAPAROSCOPIC APPENDECTOMY N/A 02/16/2013   Procedure: APPENDECTOMY LAPAROSCOPIC;  Surgeon: Leighton Ruff, MD;  Location: [facility];  Service: General;  Laterality: N/A;  . LUMBAR LAMINECTOMY/DECOMPRESSION MICRODISCECTOMY  10/16/2011   Procedure: LUMBAR LAMINECTOMY/DECOMPRESSION MICRODISCECTOMY;   Surgeon: Johnn Hai, MD;  Location: [facility];  Service: Orthopedics;  Laterality: Right;  Micro Lumbar Decompression L4-L5 on Right  . TONSILLECTOMY    . TONSILLECTOMY      Family History  Problem Relation Age of Onset  . Heart disease Father 64       CAD  . Arthritis Mother   . Hypertension Other   . Cancer Other        grandparent, lung  . Stroke Other        grandparent    SOCIAL HX: Still smokes some.  No other oral tobacco use history   Current Outpatient Medications:  .  aspirin EC 81 MG tablet, Take 81 mg by mouth 2 (two) times daily., Disp: , Rfl:  .  fluticasone (FLONASE) 50 MCG/ACT nasal spray, Place 2 sprays into both nostrils daily., Disp: 16 g, Rfl: 6 .  meloxicam (MOBIC) 15 MG tablet, Mobic 15 mg tablet  Take 1 tablet every day by oral route as needed for 30 days.  take with meals, Disp: , Rfl:  .  methocarbamol (ROBAXIN) 500 MG tablet, methocarbamol 500 mg tablet  TK 1 T PO TID FOR 10 DAYS PRN, Disp: , Rfl:  .  Multiple Vitamin (MULITIVITAMIN WITH MINERALS) TABS, Take 1 tablet by mouth daily., Disp: , Rfl:  .  oxyCODONE-acetaminophen (PERCOCET) 5-325 MG tablet, Percocet 5 mg-325 mg tablet  Take 1 tablet twice a day by oral route as needed for 7 days., Disp: , Rfl:  .  Plant Sterol  Stanol-Pantethine (CHOLEST OFF COMPLETE) 450-75 MG TABS, Take 2 tablets by mouth 2 (two) times daily. , Disp: , Rfl:  .  VENTOLIN HFA 108 (90 Base) MCG/ACT inhaler, INHALE 1-2 PUFFS BY MOUTH EVERY 4 HOURS INTO THE LUNGS AS NEEDED FOR WHEEZING OR SHORTNESS OF BREATH, Disp: 18 g, Rfl: 1  EXAM:  VITALS per patient if applicable:  GENERAL: alert, oriented, appears well and in no acute distress  HEENT: atraumatic, conjunttiva clear, no obvious abnormalities on inspection of external nose and ears  NECK: normal movements of the head and neck  LUNGS: on inspection no signs of respiratory distress, breathing rate appears normal, no obvious gross SOB, gasping or wheezing  CV: no obvious  cyanosis  MS: moves all visible extremities without noticeable abnormality  PSYCH/NEURO: pleasant and cooperative, no obvious depression or anxiety, speech and thought processing grossly intact  ASSESSMENT AND PLAN:  Discussed the following assessment and plan:  Right-sided neck pain.  Patient is describing possible unilateral adenopathy.  No obvious infectious symptoms  -We recommend an office evaluation given the unilateral nature of his possible adenopathy and he will come later today to be evaluated in office     I discussed the assessment and treatment plan with the patient. The patient was provided an opportunity to ask questions and all were answered. The patient agreed with the plan and demonstrated an understanding of the instructions.   The patient was advised to call back or seek an in-person evaluation if the symptoms worsen or if the condition fails to improve as anticipated.   Carolann Littler, MD

## 2019-01-27 ENCOUNTER — Other Ambulatory Visit: Payer: Self-pay | Admitting: Family Medicine

## 2019-05-19 ENCOUNTER — Other Ambulatory Visit: Payer: Self-pay | Admitting: Family Medicine

## 2019-06-30 ENCOUNTER — Encounter: Payer: Self-pay | Admitting: Podiatry

## 2019-06-30 ENCOUNTER — Ambulatory Visit: Payer: 59 | Admitting: Podiatry

## 2019-06-30 ENCOUNTER — Other Ambulatory Visit: Payer: Self-pay

## 2019-06-30 VITALS — BP 136/89 | HR 84 | Resp 16

## 2019-06-30 DIAGNOSIS — L600 Ingrowing nail: Secondary | ICD-10-CM | POA: Diagnosis not present

## 2019-06-30 MED ORDER — NEOMYCIN-POLYMYXIN-HC 1 % OT SOLN: OTIC | 1 refills | Status: DC

## 2019-06-30 NOTE — Patient Instructions (Signed)

## 2019-06-30 NOTE — Progress Notes (Signed)
Subjective:  Patient ID: Brett Morgan, male    DOB: 02/12/1971,  MRN: [id number] HPI Chief Complaint  Patient presents with  . Toe Pain    2nd toe right - lateral border, tender x 1-2 months  . New Patient (Initial Visit)    Est pt 2017    49 y.o. male presents with the above complaint.   ROS: Denies fever chills nausea vomiting muscle aches pains calf pain back pain chest pain shortness of breath.  Past Medical History:  Diagnosis Date  . ASTHMA 04/18/2009   Qualifier: Diagnosis of  By: Valma Cava LPN, Izora Gala    . HYPERLIPIDEMIA 06/06/2010   Qualifier: Diagnosis of  By: Elease Hashimoto MD, [name]    . 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 Personal history of urinary calculi 04/18/2009   Qualifier: Diagnosis of  By: Valma Cava LPN, Izora Gala     Past Surgical History:  Procedure Laterality Date  . APPENDECTOMY    . KNEE SURGERY     arthroscopic left  . LAPAROSCOPIC APPENDECTOMY N/A 02/16/2013   Procedure: APPENDECTOMY LAPAROSCOPIC;  Surgeon: Leighton Ruff, MD;  Location: [facility];  Service: General;  Laterality: N/A;  . LUMBAR LAMINECTOMY/DECOMPRESSION MICRODISCECTOMY  10/16/2011   Procedure: LUMBAR LAMINECTOMY/DECOMPRESSION MICRODISCECTOMY;  Surgeon: Johnn Hai, MD;  Location: [facility];  Service: Orthopedics;  Laterality: Right;  Micro Lumbar Decompression L4-L5 on Right  . TONSILLECTOMY    . TONSILLECTOMY      Current Outpatient Medications:  .  aspirin EC 81 MG tablet, Take 81 mg by mouth 2 (two) times daily., Disp: , Rfl:  .  fluticasone (FLONASE) 50 MCG/ACT nasal spray, Place 2 sprays into both nostrils daily., Disp: 16 g, Rfl: 6 .  meloxicam (MOBIC) 15 MG tablet, Mobic 15 mg tablet  Take 1 tablet every day by oral route as needed for 30 days.  take with meals, Disp: , Rfl:  .  methocarbamol (ROBAXIN) 500 MG tablet, methocarbamol 500 mg tablet  TK 1 T PO TID FOR 10 DAYS PRN, Disp: , Rfl:  .  Multiple Vitamin (MULITIVITAMIN WITH MINERALS) TABS, Take 1 tablet by mouth daily., Disp: , Rfl:    .  NEOMYCIN-POLYMYXIN-HYDROCORTISONE (CORTISPORIN) 1 % SOLN OTIC solution, Apply 1-2 drops to toe BID after soaking, Disp: 10 mL, Rfl: 1 .  oxyCODONE-acetaminophen (PERCOCET) 5-325 MG tablet, Percocet 5 mg-325 mg tablet  Take 1 tablet twice a day by oral route as needed for 7 days., Disp: , Rfl:  .  Plant Sterol Stanol-Pantethine (CHOLEST OFF COMPLETE) 450-75 MG TABS, Take 2 tablets by mouth 2 (two) times daily. , Disp: , Rfl:  .  VENTOLIN HFA 108 (90 Base) MCG/ACT inhaler, INHALE 1 TO 2 PUFFS BY MOUTH EVERY 4 HOURS AS NEEDED FOR WHEEZING OR SHORTNESS OF BREATH, Disp: 18 g, Rfl: 1  No Known Allergies Review of Systems Objective:   Vitals:   06/30/19 1409  BP: 136/89  Pulse: 84  Resp: 16    General: Well developed, nourished, in no acute distress, alert and oriented x3   Dermatological: Skin is warm, dry and supple bilateral. Nails x 10 are well maintained; remaining integument appears unremarkable at this time. There are no open sores, no preulcerative lesions, no rash or signs of infection present.  Sharply rated nail margin fibular border second digit right exquisitely tender on palpation mild erythema.  No purulence no malodor.  Vascular: Dorsalis Pedis artery and Posterior Tibial artery pedal pulses are 2/4 bilateral with immedate capillary fill  time. Pedal hair growth present. No varicosities and no lower extremity edema present bilateral.   Neruologic: Grossly intact via light touch bilateral. Vibratory intact via tuning fork bilateral. Protective threshold with Semmes Wienstein monofilament intact to all pedal sites bilateral. Patellar and Achilles deep tendon reflexes 2+ bilateral. No Babinski or clonus noted bilateral.   Musculoskeletal: No gross boney pedal deformities bilateral. No pain, crepitus, or limitation noted with foot and ankle range of motion bilateral. Muscular strength 5/5 in all groups tested bilateral.  Gait: Unassisted, Nonantalgic.    Radiographs:  None  taken  Assessment & Plan:   Assessment: Ingrown toenail fibular border second digit right foot  Plan: Chemical matricectomy was performed fibular border second digit right foot after local anesthetic was administered tolerated procedure well without complications.  Was provided with both oral and written home-going structure for the care and soaking of the toe as well as a prescription for Cortisporin Otic to be applied twice daily after soaking.     Sawyer Mentzer T. [place], [place]

## 2019-07-14 ENCOUNTER — Encounter: Payer: Self-pay | Admitting: Podiatry

## 2019-07-14 ENCOUNTER — Other Ambulatory Visit: Payer: Self-pay

## 2019-07-14 ENCOUNTER — Ambulatory Visit (INDEPENDENT_AMBULATORY_CARE_PROVIDER_SITE_OTHER): Payer: 59 | Admitting: Podiatry

## 2019-07-14 DIAGNOSIS — Z9889 Other specified postprocedural states: Secondary | ICD-10-CM

## 2019-07-14 DIAGNOSIS — L600 Ingrowing nail: Secondary | ICD-10-CM

## 2019-07-14 NOTE — Progress Notes (Signed)
He presents today for follow-up of nail procedure second toe fibular border right foot.  States that is doing just great.  No problems whatsoever.  Objective: There is no erythema edema cellulitis drainage or odor.  No pain on palpation.  Assessment: Well-healing surgical toe.  Plan: Follow-up with me on an as-needed basis.

## 2019-08-25 ENCOUNTER — Ambulatory Visit: Payer: 59 | Attending: Internal Medicine

## 2019-08-25 DIAGNOSIS — Z23 Encounter for immunization: Secondary | ICD-10-CM

## 2019-08-25 NOTE — Progress Notes (Signed)
   Covid-19 Vaccination Clinic  Name:  Brett Morgan    MRN: [id number] DOB: 1970-11-08  08/25/2019  [name] was observed post Covid-19 immunization for 15 minutes without incident. He was provided with Vaccine Information Sheet and instruction to access the V-Safe system.   [name] was instructed to call 911 with any severe reactions post vaccine: [name] Kitchen Difficulty breathing  . Swelling of face and throat  . A fast heartbeat  . A bad rash all over body  . Dizziness and weakness   Immunizations Administered    Name Date Dose VIS Date Route   Pfizer COVID-19 Vaccine 08/25/2019 11:01 AM 0.3 mL 05/13/2019 Intramuscular   Manufacturer: [place]   Lot: [id number]   [place]: [id number]

## 2019-09-19 ENCOUNTER — Ambulatory Visit: Payer: 59 | Attending: Internal Medicine

## 2019-09-19 DIAGNOSIS — Z23 Encounter for immunization: Secondary | ICD-10-CM

## 2019-09-19 NOTE — Progress Notes (Signed)
   Covid-19 Vaccination Clinic  Name:  Brett Morgan    MRN: [id number] DOB: Jul 25, 1970  09/19/2019  Mr. [name] was observed post Covid-19 immunization for 15 minutes without incident. He was provided with Vaccine Information Sheet and instruction to access the V-Safe system.   [name] was instructed to call 911 with any severe reactions post vaccine: [name] Kitchen Difficulty breathing  . Swelling of face and throat  . A fast heartbeat  . A bad rash all over body  . Dizziness and weakness   Immunizations Administered    Name Date Dose VIS Date Route   Pfizer COVID-19 Vaccine 09/19/2019 10:50 AM 0.3 mL 07/27/2018 Intramuscular   Manufacturer: [place]   Lot: [id number]   [place]: [id number]

## 2019-10-06 ENCOUNTER — Other Ambulatory Visit: Payer: Self-pay | Admitting: Family Medicine

## 2019-10-12 ENCOUNTER — Telehealth: Payer: Self-pay | Admitting: Family Medicine

## 2019-10-12 NOTE — Telephone Encounter (Signed)
Pharmacy want to know if it can be generic INS want pay for brand

## 2019-10-12 NOTE — Telephone Encounter (Signed)
Called pharmacy and switched to generic

## 2019-10-12 NOTE — Telephone Encounter (Signed)
Please advise if okay to switch to generic

## 2019-10-12 NOTE — Telephone Encounter (Signed)
yes

## 2019-10-17 ENCOUNTER — Ambulatory Visit (INDEPENDENT_AMBULATORY_CARE_PROVIDER_SITE_OTHER): Payer: 59 | Admitting: Family Medicine

## 2019-10-17 ENCOUNTER — Encounter: Payer: Self-pay | Admitting: Family Medicine

## 2019-10-17 ENCOUNTER — Other Ambulatory Visit: Payer: Self-pay

## 2019-10-17 VITALS — BP 126/68 | HR 85 | Temp 97.7°F | Ht 71.0 in | Wt 235.0 lb

## 2019-10-17 DIAGNOSIS — Z0000 Encounter for general adult medical examination without abnormal findings: Secondary | ICD-10-CM | POA: Diagnosis not present

## 2019-10-17 NOTE — Progress Notes (Signed)
Subjective:     Patient ID: Brett Morgan, male   DOB: 18-Mar-1971, 49 y.o.   MRN: [id number]  HPI   Brett Morgan is seen for physical exam.  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  He has been doing a lot of hiking recently.  Denies any specific complaints.  He still smokes but very infrequently.  Is on nicotine replacement trying to quit  Immunizations up-to-date.  He has had Covid vaccine.  No family history of colon cancer or prostate cancer in first-degree relative.  No family history of diabetes or premature CAD.  Past Medical History:  Diagnosis Date  . ASTHMA 04/18/2009   Qualifier: Diagnosis of  By: Valma Cava LPN, Izora Gala    . HYPERLIPIDEMIA 06/06/2010   Qualifier: Diagnosis of  By: Elease Hashimoto MD, Avarie Tavano    . 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 Personal history of urinary calculi 04/18/2009   Qualifier: Diagnosis of  By: Valma Cava LPN, Izora Gala     Past Surgical History:  Procedure Laterality Date  . APPENDECTOMY    . KNEE SURGERY     arthroscopic left  . LAPAROSCOPIC APPENDECTOMY N/A 02/16/2013   Procedure: APPENDECTOMY LAPAROSCOPIC;  Surgeon: Leighton Ruff, MD;  Location: [facility];  Service: General;  Laterality: N/A;  . LUMBAR LAMINECTOMY/DECOMPRESSION MICRODISCECTOMY  10/16/2011   Procedure: LUMBAR LAMINECTOMY/DECOMPRESSION MICRODISCECTOMY;  Surgeon: Johnn Hai, MD;  Location: [facility];  Service: Orthopedics;  Laterality: Right;  Micro Lumbar Decompression L4-L5 on Right  . TONSILLECTOMY    . TONSILLECTOMY      reports that he has been smoking cigarettes and cigars. He has a 25.00 pack-year smoking history. He has quit using smokeless tobacco. He reports current alcohol use. He reports that he does not use drugs. family history includes Arthritis in his mother; Cancer in an other family member; Heart disease (age of onset: 26) in his father; Hypertension in an other family member; Stroke in an other family member. No Known Allergies   Review of Systems  Constitutional:  Negative for activity change, appetite change, fatigue, fever and unexpected weight change.  HENT: Negative for congestion, ear pain and trouble swallowing.   Eyes: Negative for pain and visual disturbance.  Respiratory: Negative for cough, shortness of breath and wheezing.   Cardiovascular: Negative for chest pain and palpitations.  Gastrointestinal: Negative for abdominal distention, abdominal pain, blood in stool, constipation, diarrhea, nausea, rectal pain and vomiting.  Endocrine: Negative for polydipsia and polyuria.  Genitourinary: Negative for dysuria, hematuria and testicular pain.  Musculoskeletal: Negative for arthralgias and joint swelling.  Skin: Negative for rash.  Neurological: Negative for dizziness, syncope and headaches.  Hematological: Negative for adenopathy.  Psychiatric/Behavioral: Negative for confusion and dysphoric mood.       Objective:   Physical Exam Constitutional:      General: He is not in acute distress.    Appearance: He is well-developed.  HENT:     Head: Normocephalic and atraumatic.     Right Ear: External ear normal.     Left Ear: External ear normal.  Eyes:     Conjunctiva/sclera: Conjunctivae normal.     Pupils: Pupils are equal, round, and reactive to light.  Neck:     Thyroid: No thyromegaly.  Cardiovascular:     Rate and Rhythm: Normal rate and regular rhythm.     Heart sounds: Normal heart sounds. No murmur.  Pulmonary:     Effort: No respiratory distress.     Breath  sounds: No wheezing or rales.  Abdominal:     General: Bowel sounds are normal. There is no distension.     Palpations: Abdomen is soft. There is no mass.     Tenderness: There is no abdominal tenderness. There is no guarding or rebound.  Musculoskeletal:     Cervical back: Normal range of motion and neck supple.     Right lower leg: No edema.     Left lower leg: No edema.  Lymphadenopathy:     Cervical: No cervical adenopathy.  Skin:    Findings: No rash.   Neurological:     Mental Status: He is alert and oriented to person, place, and time.     Cranial Nerves: No cranial nerve deficit.     Deep Tendon Reflexes: Reflexes normal.        Assessment:     Physical exam.  Patient has longstanding history of nicotine use but smokes infrequently and is trying to scale back and quit.    Plan:     -We discussed labs and he would like to wait till next year -Forms completed for unrestricted scouting activity.  He is borderline for height and weight restrictions but plans to lose some further weight between now and his backpacking trip this summer. -Continue with annual flu vaccine  Eulas Post MD [facility]

## 2019-10-17 NOTE — Patient Instructions (Signed)
Preventive Care 41-48 Years Old, Male Preventive care refers to lifestyle choices and visits with your health care provider that can promote health and wellness. This includes:  A yearly physical exam. This is also called an annual well check.  Regular dental and eye exams.  Immunizations.  Screening for certain conditions.  Healthy lifestyle choices, such as eating a healthy diet, getting regular exercise, not using drugs or products that contain nicotine and tobacco, and limiting alcohol use. What can I expect for my preventive care visit? Physical exam Your health care provider will check:  Height and weight. These may be used to calculate body mass index (BMI), which is a measurement that tells if you are at a healthy weight.  Heart rate and blood pressure.  Your skin for abnormal spots. Counseling Your health care provider may ask you questions about:  Alcohol, tobacco, and drug use.  Emotional well-being.  Home and relationship well-being.  Sexual activity.  Eating habits.  Work and work Statistician. What immunizations do I need?  Influenza (flu) vaccine  This is recommended every year. Tetanus, diphtheria, and pertussis (Tdap) vaccine  You may need a Td booster every 10 years. Varicella (chickenpox) vaccine  You may need this vaccine if you have not already been vaccinated. Zoster (shingles) vaccine  You may need this after age 64. Measles, mumps, and rubella (MMR) vaccine  You may need at least one dose of MMR if you were born in 1957 or later. You may also need a second dose. Pneumococcal conjugate (PCV13) vaccine  You may need this if you have certain conditions and were not previously vaccinated. Pneumococcal polysaccharide (PPSV23) vaccine  You may need one or two doses if you smoke cigarettes or if you have certain conditions. Meningococcal conjugate (MenACWY) vaccine  You may need this if you have certain conditions. Hepatitis A  vaccine  You may need this if you have certain conditions or if you travel or work in places where you may be exposed to hepatitis A. Hepatitis B vaccine  You may need this if you have certain conditions or if you travel or work in places where you may be exposed to hepatitis B. Haemophilus influenzae type b (Hib) vaccine  You may need this if you have certain risk factors. Human papillomavirus (HPV) vaccine  If recommended by your health care provider, you may need three doses over 6 months. You may receive vaccines as individual doses or as more than one vaccine together in one shot (combination vaccines). Talk with your health care provider about the risks and benefits of combination vaccines. What tests do I need? Blood tests  Lipid and cholesterol levels. These may be checked every 5 years, or more frequently if you are over 60 years old.  Hepatitis C test.  Hepatitis B test. Screening  Lung cancer screening. You may have this screening every year starting at age 43 if you have a 30-pack-year history of smoking and currently smoke or have quit within the past 15 years.  Prostate cancer screening. Recommendations will vary depending on your family history and other risks.  Colorectal cancer screening. All adults should have this screening starting at age 72 and continuing until age 2. Your health care provider may recommend screening at age 14 if you are at increased risk. You will have tests every 1-10 years, depending on your results and the type of screening test.  Diabetes screening. This is done by checking your blood sugar (glucose) after you have not eaten  for a while (fasting). You may have this done every 1-3 years.  Sexually transmitted disease (STD) testing. Follow these instructions at home: Eating and drinking  Eat a diet that includes fresh fruits and vegetables, whole grains, lean protein, and low-fat dairy products.  Take vitamin and mineral supplements as  recommended by your health care provider.  Do not drink alcohol if your health care provider tells you not to drink.  If you drink alcohol: ? Limit how much you have to 0-2 drinks a day. ? Be aware of how much alcohol is in your drink. In the [place] one drink equals one 12 oz bottle of beer (355 mL), one 5 oz glass of wine (148 mL), or one 1 oz glass of hard liquor (44 mL). Lifestyle  Take daily care of your teeth and gums.  Stay active. Exercise for at least 30 minutes on 5 or more days each week.  Do not use any products that contain nicotine or tobacco, such as cigarettes, e-cigarettes, and chewing tobacco. If you need help quitting, ask your health care provider.  If you are sexually active, practice safe sex. Use a condom or other form of protection to prevent STIs (sexually transmitted infections).  Talk with your health care provider about taking a low-dose aspirin every day starting at age 53. What's next?  Go to your health care provider once a year for a well check visit.  Ask your health care provider how often you should have your eyes and teeth checked.  Stay up to date on all vaccines. This information is not intended to replace advice given to you by your health care provider. Make sure you discuss any questions you have with your health care provider. Document Revised: 05/13/2018 Document Reviewed: 05/13/2018 Elsevier Patient Education  2020 [employer].

## 2019-11-10 DIAGNOSIS — M25562 Pain in left knee: Secondary | ICD-10-CM | POA: Insufficient documentation

## 2020-03-05 ENCOUNTER — Other Ambulatory Visit: Payer: 59

## 2020-03-05 DIAGNOSIS — Z20822 Contact with and (suspected) exposure to covid-19: Secondary | ICD-10-CM

## 2020-03-06 LAB — SARS-COV-2, NAA 2 DAY TAT

## 2020-03-06 LAB — NOVEL CORONAVIRUS, NAA: SARS-CoV-2, NAA: NOT DETECTED

## 2020-08-20 ENCOUNTER — Other Ambulatory Visit: Payer: Self-pay

## 2020-08-21 ENCOUNTER — Ambulatory Visit: Payer: 59 | Admitting: Family Medicine

## 2020-08-21 ENCOUNTER — Encounter: Payer: Self-pay | Admitting: Family Medicine

## 2020-08-21 VITALS — BP 128/80 | HR 92 | Temp 97.5°F | Ht 71.0 in | Wt 241.3 lb

## 2020-08-21 DIAGNOSIS — R030 Elevated blood-pressure reading, without diagnosis of hypertension: Secondary | ICD-10-CM | POA: Diagnosis not present

## 2020-08-21 NOTE — Patient Instructions (Signed)

## 2020-08-21 NOTE — Progress Notes (Signed)
Established Patient Office Visit  Subjective:  Patient ID: Brett Morgan, male    DOB: 1970/10/04  Age: 50 y.o. MRN: [id number]  CC:  Chief Complaint  Patient presents with  . blood pressure    Has had some high bp readings at home with home blood pressure cuff. Was on meloxicam for arm injury & BP was high while on that.     HPI Brett Morgan presents for recent elevated blood pressure readings.  He has been seeing another provider because of Workmen's Comp. issue with right elbow pain and has been on both naproxen and meloxicam and he thinks it may have elevated his blood pressure.  At one of his Workmen's Comp. follow-up visits he had systolic reading 941.  Both parents have hypertension.  Patient is currently training for half marathon and running several days per week.  Quit smoking over a year ago.  No regular alcohol use.  Currently not consuming nonsteroidals.  Past Medical History:  Diagnosis Date  . ASTHMA 04/18/2009   Qualifier: Diagnosis of  By: Valma Cava LPN, Izora Gala    . HYPERLIPIDEMIA 06/06/2010   Qualifier: Diagnosis of  By: Elease Hashimoto MD, Danil Wedge    . 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 Personal history of urinary calculi 04/18/2009   Qualifier: Diagnosis of  By: Valma Cava LPN, Izora Gala      Past Surgical History:  Procedure Laterality Date  . APPENDECTOMY    . KNEE SURGERY     arthroscopic left  . LAPAROSCOPIC APPENDECTOMY N/A 02/16/2013   Procedure: APPENDECTOMY LAPAROSCOPIC;  Surgeon: Leighton Ruff, MD;  Location: [facility];  Service: General;  Laterality: N/A;  . LUMBAR LAMINECTOMY/DECOMPRESSION MICRODISCECTOMY  10/16/2011   Procedure: LUMBAR LAMINECTOMY/DECOMPRESSION MICRODISCECTOMY;  Surgeon: Johnn Hai, MD;  Location: [facility];  Service: Orthopedics;  Laterality: Right;  Micro Lumbar Decompression L4-L5 on Right  . TONSILLECTOMY    . TONSILLECTOMY      Family History  Problem Relation Age of Onset  . Heart disease Father 84       CAD  . Arthritis Mother   .  Hypertension Other   . Cancer Other        grandparent, lung  . Stroke Other        grandparent    Social History   Socioeconomic History  . Marital status: Single    Spouse name: Not on file  . Number of children: Not on file  . Years of education: Not on file  . Highest education level: Not on file  Occupational History  . Not on file  Tobacco Use  . Smoking status: Current Every Day Smoker    Packs/day: 1.00    Years: 25.00    Pack years: 25.00    Types: Cigarettes, Cigars    Last attempt to quit: 08/04/2012    Years since quitting: 8.0  . Smokeless tobacco: Former Systems developer  . Tobacco comment: using patch and zyban -trying to quit  Vaping Use  . Vaping Use: Never used  Substance and Sexual Activity  . Alcohol use: Yes    Comment: rarely  . Drug use: No  . Sexual activity: Not on file  Other Topics Concern  . Not on file  Social History Narrative  . Not on file   Social Determinants of Health   Financial Resource Strain: Not on file  Food Insecurity: Not on file  Transportation Needs: Not on file  Physical Activity: Not on file  Stress:  Not on file  Social Connections: Not on file  Intimate Partner Violence: Not on file    Outpatient Medications Prior to Visit  Medication Sig Dispense Refill  . aspirin EC 81 MG tablet Take 81 mg by mouth 2 (two) times daily.    . fluticasone (FLONASE) 50 MCG/ACT nasal spray Place 2 sprays into both nostrils daily. 16 g 6  . Multiple Vitamin (MULITIVITAMIN WITH MINERALS) TABS Take 1 tablet by mouth daily.    . Plant Sterol Stanol-Pantethine 450-75 MG TABS Take 2 tablets by mouth 2 (two) times daily.     . VENTOLIN HFA 108 (90 Base) MCG/ACT inhaler INHALE 1 TO 2 PUFFS BY MOUTH EVERY 4 HOURS AS NEEDED FOR WHEEZING OR SHORTNESS OF BREATH 18 g 1  . meloxicam (MOBIC) 15 MG tablet Mobic 15 mg tablet  Take 1 tablet every day by oral route as needed for 30 days.  take with meals    . methocarbamol (ROBAXIN) 500 MG tablet methocarbamol  500 mg tablet  TK 1 T PO TID FOR 10 DAYS PRN    . NEOMYCIN-POLYMYXIN-HYDROCORTISONE (CORTISPORIN) 1 % SOLN OTIC solution Apply 1-2 drops to toe BID after soaking 10 mL 1  . oxyCODONE-acetaminophen (PERCOCET) 5-325 MG tablet Percocet 5 mg-325 mg tablet  Take 1 tablet twice a day by oral route as needed for 7 days.     No facility-administered medications prior to visit.    No Known Allergies  ROS Review of Systems  Constitutional: Negative for fatigue.  Eyes: Negative for visual disturbance.  Respiratory: Negative for cough, chest tightness and shortness of breath.   Cardiovascular: Negative for chest pain, palpitations and leg swelling.  Neurological: Negative for dizziness, syncope, weakness, light-headedness and headaches.      Objective:    Physical Exam Constitutional:      Appearance: He is well-developed.  HENT:     Right Ear: External ear normal.     Left Ear: External ear normal.  Eyes:     Pupils: Pupils are equal, round, and reactive to light.  Neck:     Thyroid: No thyromegaly.  Cardiovascular:     Rate and Rhythm: Normal rate and regular rhythm.  Pulmonary:     Effort: Pulmonary effort is normal. No respiratory distress.     Breath sounds: Normal breath sounds. No wheezing or rales.  Musculoskeletal:     Cervical back: Neck supple.     Right lower leg: No edema.     Left lower leg: No edema.  Neurological:     Mental Status: He is alert and oriented to person, place, and time.     BP 128/80   Pulse 92   Temp (!) 97.5 F (36.4 C) (Oral)   Ht 5\' 11"  (1.803 m)   Wt 241 lb 5 oz (109.5 kg)   SpO2 98%   BMI 33.66 kg/m  Wt Readings from Last 3 Encounters:  08/21/20 241 lb 5 oz (109.5 kg)  10/17/19 235 lb (106.6 kg)  07/20/18 243 lb 9.6 oz (110.5 kg)     Health Maintenance Due  Topic Date Due  . Hepatitis C Screening  Never done  . HIV Screening  Never done  . COLONOSCOPY (Pts 45-70yrs Insurance coverage will need to be confirmed)  Never done  .  INFLUENZA VACCINE  01/01/2020    There are no preventive care reminders to display for this patient.  Lab Results  Component Value Date   TSH 1.49 11/17/2017   Lab Results  Component Value Date   WBC 8.8 11/17/2017   HGB 14.0 11/17/2017   HCT 40.5 11/17/2017   MCV 93.5 11/17/2017   PLT 205.0 11/17/2017   Lab Results  Component Value Date   NA 141 11/17/2017   K 4.5 11/17/2017   CO2 28 11/17/2017   GLUCOSE 99 11/17/2017   BUN 16 11/17/2017   CREATININE 0.87 11/17/2017   BILITOT 0.3 11/17/2017   ALKPHOS 76 11/17/2017   AST 20 11/17/2017   ALT 24 11/17/2017   PROT 6.4 11/17/2017   ALBUMIN 4.0 11/17/2017   CALCIUM 9.2 11/17/2017   GFR 100.48 11/17/2017   Lab Results  Component Value Date   CHOL 187 11/17/2017   Lab Results  Component Value Date   HDL 35.30 (L) 11/17/2017   Lab Results  Component Value Date   LDLCALC 134 (H) 11/17/2017   Lab Results  Component Value Date   TRIG 90.0 11/17/2017   Lab Results  Component Value Date   CHOLHDL 5 11/17/2017   No results found for: HGBA1C    Assessment & Plan:   Problem List Items Addressed This Visit   None   Visit Diagnoses    Elevated blood pressure reading    -  Primary    Patient has had couple of recent sporadic elevated blood pressure readings.  Repeat today left arm seated after rest large cuff 138/80  -We recommended nonpharmacologic management at this point with weight loss, regular aerobic exercise, watching sodium intake and he has physical scheduled in 2 months.  Reassess at that point.  If he is getting frequent readings above 140/90 consider initiating medication such as ACE inhibitor or ARB at that time  No orders of the defined types were placed in this encounter.   Follow-up: No follow-ups on file.    Carolann Littler, MD

## 2020-10-17 ENCOUNTER — Other Ambulatory Visit: Payer: Self-pay

## 2020-10-17 ENCOUNTER — Ambulatory Visit (INDEPENDENT_AMBULATORY_CARE_PROVIDER_SITE_OTHER): Payer: 59 | Admitting: Family Medicine

## 2020-10-17 ENCOUNTER — Encounter: Payer: Self-pay | Admitting: Family Medicine

## 2020-10-17 VITALS — BP 130/68 | HR 77 | Temp 97.8°F | Ht 71.0 in | Wt 239.8 lb

## 2020-10-17 DIAGNOSIS — Z0000 Encounter for general adult medical examination without abnormal findings: Secondary | ICD-10-CM | POA: Diagnosis not present

## 2020-10-17 LAB — CBC WITH DIFFERENTIAL/PLATELET
Basophils Absolute: 0 10*3/uL (ref 0.0–0.1)
Basophils Relative: 0.4 % (ref 0.0–3.0)
Eosinophils Absolute: 0.1 10*3/uL (ref 0.0–0.7)
Eosinophils Relative: 1.2 % (ref 0.0–5.0)
HCT: 39.3 % (ref 39.0–52.0)
Hemoglobin: 13.5 g/dL (ref 13.0–17.0)
Lymphocytes Relative: 26.6 % (ref 12.0–46.0)
Lymphs Abs: 2.6 10*3/uL (ref 0.7–4.0)
MCHC: 34.3 g/dL (ref 30.0–36.0)
MCV: 93.2 fl (ref 78.0–100.0)
Monocytes Absolute: 0.5 10*3/uL (ref 0.1–1.0)
Monocytes Relative: 5.1 % (ref 3.0–12.0)
Neutro Abs: 6.6 10*3/uL (ref 1.4–7.7)
Neutrophils Relative %: 66.7 % (ref 43.0–77.0)
Platelets: 206 10*3/uL (ref 150.0–400.0)
RBC: 4.22 Mil/uL (ref 4.22–5.81)
RDW: 13.7 % (ref 11.5–15.5)
WBC: 9.9 10*3/uL (ref 4.0–10.5)

## 2020-10-17 LAB — LIPID PANEL
Cholesterol: 203 mg/dL — ABNORMAL HIGH (ref 0–200)
HDL: 35.2 mg/dL — ABNORMAL LOW (ref >39.00)
LDL Cholesterol: 146 mg/dL — ABNORMAL HIGH (ref 0–99)
NonHDL: 167.75
Total CHOL/HDL Ratio: 6
Triglycerides: 109 mg/dL (ref 0.0–149.0)
VLDL: 21.8 mg/dL (ref 0.0–40.0)

## 2020-10-17 LAB — TSH: TSH: 1.26 u[IU]/mL (ref 0.35–4.50)

## 2020-10-17 LAB — BASIC METABOLIC PANEL
BUN: 12 mg/dL (ref 6–23)
CO2: 27 mEq/L (ref 19–32)
Calcium: 9.5 mg/dL (ref 8.4–10.5)
Chloride: 104 mEq/L (ref 96–112)
Creatinine, Ser: 0.86 mg/dL (ref 0.40–1.50)
GFR: 102.22 mL/min (ref >60.00)
Glucose, Bld: 95 mg/dL (ref 70–99)
Potassium: 4.5 mEq/L (ref 3.5–5.1)
Sodium: 141 mEq/L (ref 135–145)

## 2020-10-17 LAB — HEPATIC FUNCTION PANEL
ALT: 14 U/L (ref 0–53)
AST: 13 U/L (ref 0–37)
Albumin: 4.4 g/dL (ref 3.5–5.2)
Alkaline Phosphatase: 80 U/L (ref 39–117)
Bilirubin, Direct: 0.1 mg/dL (ref 0.0–0.3)
Total Bilirubin: 0.4 mg/dL (ref 0.2–1.2)
Total Protein: 6.7 g/dL (ref 6.0–8.3)

## 2020-10-17 NOTE — Addendum Note (Signed)
Addended by: Elmer Picker on: 10/17/2020 08:08 AM   Modules accepted: Orders

## 2020-10-17 NOTE — Progress Notes (Signed)
Established Patient Office Visit  Subjective:  Patient ID: Brett Morgan, male    DOB: April 09, 1971  Age: 50 y.o. MRN: [id number]  CC: No chief complaint on file.   HPI Brett Morgan presents for physical exam.  He has past history of mild intermittent asthma, hyperlipidemia, history of kidney stones.  He has had prior lumbar laminectomy.  Health maintenance reviewed  -Has had COVID vaccines -Tetanus due 2027 -No history of hepatitis C screening but low risk.  Family history-Father had history of coronary disease age 2.  His mother is alive and well.  No known family history of diabetes.  Social history-still uses some oral nicotine replacement.  Still rarely smokes cigarettes but for the most part quit a little over a year ago.  He is single.  No children.  Run some for exercise.  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  The 10-year ASCVD risk score Mikey Bussing DC Brooke Bonito., et al., 2013) is: 9.1%   Values used to calculate the score:     Age: 69 years     Sex: Male     Is Non-Hispanic African American: No     Diabetic: No     Tobacco smoker: Yes     Systolic Blood Pressure: 034 mmHg     Is BP treated: No     HDL Cholesterol: 35.3 mg/dL     Total Cholesterol: 187 mg/dL   Past Medical History:  Diagnosis Date  . ASTHMA 04/18/2009   Qualifier: Diagnosis of  By: Valma Cava LPN, Izora Gala    . HYPERLIPIDEMIA 06/06/2010   Qualifier: Diagnosis of  By: Elease Hashimoto MD, Kimari Lienhard    . 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 Personal history of urinary calculi 04/18/2009   Qualifier: Diagnosis of  By: Valma Cava LPN, Izora Gala      Past Surgical History:  Procedure Laterality Date  . APPENDECTOMY    . KNEE SURGERY     arthroscopic left  . LAPAROSCOPIC APPENDECTOMY N/A 02/16/2013   Procedure: APPENDECTOMY LAPAROSCOPIC;  Surgeon: Leighton Ruff, MD;  Location: [facility];  Service: General;  Laterality: N/A;  . LUMBAR LAMINECTOMY/DECOMPRESSION MICRODISCECTOMY  10/16/2011   Procedure: LUMBAR  LAMINECTOMY/DECOMPRESSION MICRODISCECTOMY;  Surgeon: Johnn Hai, MD;  Location: [facility];  Service: Orthopedics;  Laterality: Right;  Micro Lumbar Decompression L4-L5 on Right  . TONSILLECTOMY    . TONSILLECTOMY      Family History  Problem Relation Age of Onset  . Heart disease Father 97       CAD  . Arthritis Mother   . Hypertension Other   . Cancer Other        grandparent, lung  . Stroke Other        grandparent    Social History   Socioeconomic History  . Marital status: Single    Spouse name: Not on file  . Number of children: Not on file  . Years of education: Not on file  . Highest education level: Not on file  Occupational History  . Not on file  Tobacco Use  . Smoking status: Current Every Day Smoker    Packs/day: 1.00    Years: 25.00    Pack years: 25.00    Types: Cigarettes, Cigars    Last attempt to quit: 08/04/2012    Years since quitting: 8.2  . Smokeless tobacco: Former Systems developer  . Tobacco comment: using patch and zyban -trying to quit  Vaping Use  . Vaping  Use: Never used  Substance and Sexual Activity  . Alcohol use: Yes    Comment: rarely  . Drug use: No  . Sexual activity: Not on file  Other Topics Concern  . Not on file  Social History Narrative  . Not on file   Social Determinants of Health   Financial Resource Strain: Not on file  Food Insecurity: Not on file  Transportation Needs: Not on file  Physical Activity: Not on file  Stress: Not on file  Social Connections: Not on file  Intimate Partner Violence: Not on file    Outpatient Medications Prior to Visit  Medication Sig Dispense Refill  . aspirin EC 81 MG tablet Take 81 mg by mouth 2 (two) times daily.    . fluticasone (FLONASE) 50 MCG/ACT nasal spray Place 2 sprays into both nostrils daily. 16 g 6  . Multiple Vitamin (MULITIVITAMIN WITH MINERALS) TABS Take 1 tablet by mouth daily.    . Plant Sterol Stanol-Pantethine 450-75 MG TABS Take 2 tablets by mouth 2 (two) times daily.      . VENTOLIN HFA 108 (90 Base) MCG/ACT inhaler INHALE 1 TO 2 PUFFS BY MOUTH EVERY 4 HOURS AS NEEDED FOR WHEEZING OR SHORTNESS OF BREATH 18 g 1   No facility-administered medications prior to visit.    No Known Allergies  ROS Review of Systems  Constitutional: Negative for activity change, appetite change, fatigue and fever.  HENT: Negative for congestion, ear pain and trouble swallowing.   Eyes: Negative for pain and visual disturbance.  Respiratory: Negative for cough, shortness of breath and wheezing.   Cardiovascular: Negative for chest pain and palpitations.  Gastrointestinal: Negative for abdominal distention, abdominal pain, blood in stool, constipation, diarrhea, nausea, rectal pain and vomiting.  Genitourinary: Negative for dysuria, hematuria and testicular pain.  Musculoskeletal: Negative for arthralgias and joint swelling.  Skin: Negative for rash.  Neurological: Negative for dizziness, syncope and headaches.  Hematological: Negative for adenopathy.  Psychiatric/Behavioral: Negative for confusion and dysphoric mood.      Objective:    Physical Exam Constitutional:      General: He is not in acute distress.    Appearance: He is well-developed.  HENT:     Head: Normocephalic and atraumatic.     Right Ear: External ear normal.     Left Ear: External ear normal.  Eyes:     Conjunctiva/sclera: Conjunctivae normal.     Pupils: Pupils are equal, round, and reactive to light.  Neck:     Thyroid: No thyromegaly.  Cardiovascular:     Rate and Rhythm: Normal rate and regular rhythm.     Heart sounds: Normal heart sounds. No murmur heard.   Pulmonary:     Effort: No respiratory distress.     Breath sounds: No wheezing or rales.  Abdominal:     General: Bowel sounds are normal. There is no distension.     Palpations: Abdomen is soft. There is no mass.     Tenderness: There is no abdominal tenderness. There is no guarding or rebound.  Musculoskeletal:     Cervical  back: Normal range of motion and neck supple.  Lymphadenopathy:     Cervical: No cervical adenopathy.  Skin:    Findings: No rash.  Neurological:     Mental Status: He is alert and oriented to person, place, and time.     Cranial Nerves: No cranial nerve deficit.     There were no vitals taken for this visit. Wt Readings from  Last 3 Encounters:  08/21/20 241 lb 5 oz (109.5 kg)  10/17/19 235 lb (106.6 kg)  07/20/18 243 lb 9.6 oz (110.5 kg)     Health Maintenance Due  Topic Date Due  . HIV Screening  Never done  . Hepatitis C Screening  Never done  . COLONOSCOPY (Pts 45-10yrs Insurance coverage will need to be confirmed)  Never done    There are no preventive care reminders to display for this patient.  Lab Results  Component Value Date   TSH 1.49 11/17/2017   Lab Results  Component Value Date   WBC 8.8 11/17/2017   HGB 14.0 11/17/2017   HCT 40.5 11/17/2017   MCV 93.5 11/17/2017   PLT 205.0 11/17/2017   Lab Results  Component Value Date   NA 141 11/17/2017   K 4.5 11/17/2017   CO2 28 11/17/2017   GLUCOSE 99 11/17/2017   BUN 16 11/17/2017   CREATININE 0.87 11/17/2017   BILITOT 0.3 11/17/2017   ALKPHOS 76 11/17/2017   AST 20 11/17/2017   ALT 24 11/17/2017   PROT 6.4 11/17/2017   ALBUMIN 4.0 11/17/2017   CALCIUM 9.2 11/17/2017   GFR 100.48 11/17/2017   Lab Results  Component Value Date   CHOL 187 11/17/2017   Lab Results  Component Value Date   HDL 35.30 (L) 11/17/2017   Lab Results  Component Value Date   LDLCALC 134 (H) 11/17/2017   Lab Results  Component Value Date   TRIG 90.0 11/17/2017   Lab Results  Component Value Date   CHOLHDL 5 11/17/2017   No results found for: HGBA1C    Assessment & Plan:   Problem List Items Addressed This Visit   None   Visit Diagnoses    Physical exam    -  Primary    50 year old male with past history of nicotine use.  Approximately 25-pack-year history.  -Obtain screening labs.  Include hepatitis C  antibody.  He appears to be low risk for hepatitis C.  -Discussed colon cancer screening and he will check to confirm with his insurance but he would like to go ahead with getting colonoscopy set up  -We discussed possible coronary calcium scan to further risk stratify with his family history of CAD.  -Continue to monitor blood pressure closely.  We have advised him to try to lose some additional weight and continue regular aerobic exercise  -Continue annual flu vaccine  No orders of the defined types were placed in this encounter.   Follow-up: No follow-ups on file.    Carolann Littler, MD

## 2020-10-17 NOTE — Patient Instructions (Signed)

## 2020-10-18 LAB — HEPATITIS C ANTIBODY
Hepatitis C Ab: NONREACTIVE
SIGNAL TO CUT-OFF: 0 (ref <1.00)

## 2020-11-16 ENCOUNTER — Ambulatory Visit (INDEPENDENT_AMBULATORY_CARE_PROVIDER_SITE_OTHER)
Admission: RE | Admit: 2020-11-16 | Discharge: 2020-11-16 | Disposition: A | Discharge status: Home / Self Care | Payer: Self-pay | Source: Ambulatory Visit | Attending: Family Medicine | Admitting: Family Medicine

## 2020-11-16 ENCOUNTER — Other Ambulatory Visit: Payer: Self-pay

## 2020-11-16 DIAGNOSIS — E785 Hyperlipidemia, unspecified: Secondary | ICD-10-CM

## 2020-12-28 ENCOUNTER — Ambulatory Visit (INDEPENDENT_AMBULATORY_CARE_PROVIDER_SITE_OTHER): Payer: Self-pay | Admitting: Sports Medicine

## 2020-12-28 ENCOUNTER — Other Ambulatory Visit: Payer: Self-pay

## 2020-12-28 ENCOUNTER — Encounter: Payer: Self-pay | Admitting: Sports Medicine

## 2020-12-28 DIAGNOSIS — M722 Plantar fascial fibromatosis: Secondary | ICD-10-CM | POA: Insufficient documentation

## 2020-12-28 NOTE — Progress Notes (Signed)
   Brett Morgan is a 50 y.o. 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  ROS as above. Medications reviewed.  Exam:  There were no vitals taken for this visit. Gen: Well NAD MSK:  Foot: He has TTP at medial aspect of calcaneus at the origin of the plantar fascia on his right foot  No results found.   Assessment and Plan: 1) Plantar fasciitis Shockwave therapy performed per procedure note below.  Patient tolerated procedure well.  Did discuss risks and benefits with him including that he may experience bruising or redness afterwards.  He will follow-up in 1 week for repeat shockwave therapy, or if no improvement will consider further examination and ultrasound of his plantar fasciitis.   Procedure: ECSWT Indications: Right plantar fasciitis   Procedure Details Consent: Risks of procedure as well as the alternatives and risks of each were explained to the patient.  Written consent for procedure obtained. Time Out: Verified patient identification, verified procedure, site was marked, verified correct patient position, medications/allergies/relevent history reviewed.  The area was cleaned with alcohol swab.     The right plantar fascia was targeted for Extracorporeal shockwave therapy.    Preset:  Plantar fasciitis Power Level: 60 Frequency: 5 Impulse/cycles: 1373 Head size: Large  After 1373 impulses, changed to power level 70, frequency 7, completed 2500 total cycles   Patient tolerated procedure well without immediate complications     Arizona Constable, D.O.  PGY-4 [facility]  12/28/2020 11:28 AM

## 2020-12-28 NOTE — Assessment & Plan Note (Signed)
Shockwave therapy performed per procedure note below.  Patient tolerated procedure well.  Did discuss risks and benefits with him including that he may experience bruising or redness afterwards.  He will follow-up in 1 week for repeat shockwave therapy, or if no improvement will consider further examination and ultrasound of his plantar fasciitis.

## 2021-01-02 ENCOUNTER — Other Ambulatory Visit: Payer: Self-pay

## 2021-01-02 ENCOUNTER — Ambulatory Visit (INDEPENDENT_AMBULATORY_CARE_PROVIDER_SITE_OTHER): Payer: Self-pay | Admitting: Family Medicine

## 2021-01-02 ENCOUNTER — Encounter: Payer: Self-pay | Admitting: Family Medicine

## 2021-01-02 DIAGNOSIS — M722 Plantar fascial fibromatosis: Secondary | ICD-10-CM

## 2021-01-02 NOTE — Progress Notes (Signed)
PCP: Eulas Post, MD  Subjective:   HPI: Patient is a 50 y.o. male here for right plantar fasciitis.  Patient returns for second ECSWT treatment for right plantar fasciitis. Felt great after last visit - played pickleball and started to develop pain that night. Past couple days has improved again though. No adverse side effects from first treatment.  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 16 g 6   Multiple Vitamin (MULITIVITAMIN WITH MINERALS) TABS Take 1 tablet by mouth daily.     Plant Sterol Stanol-Pantethine 450-75 MG TABS Take 2 tablets by mouth 2 (two) times daily.      VENTOLIN HFA 108 (90 Base) MCG/ACT inhaler INHALE 1 TO 2 PUFFS BY MOUTH EVERY 4 HOURS AS NEEDED FOR WHEEZING OR SHORTNESS OF BREATH 18 g 1   No current facility-administered medications on file prior to visit.    Past Surgical History:  Procedure Laterality Date   APPENDECTOMY     KNEE SURGERY     arthroscopic left   LAPAROSCOPIC APPENDECTOMY N/A 02/16/2013   Procedure: APPENDECTOMY LAPAROSCOPIC;  Surgeon: Leighton Ruff, MD;  Location: [facility];  Service: General;  Laterality: N/A;   LUMBAR LAMINECTOMY/DECOMPRESSION MICRODISCECTOMY  10/16/2011   Procedure: LUMBAR LAMINECTOMY/DECOMPRESSION MICRODISCECTOMY;  Surgeon: Johnn Hai, MD;  Location: [facility];  Service: Orthopedics;  Laterality: Right;  Micro Lumbar Decompression L4-L5 on Right   TONSILLECTOMY     TONSILLECTOMY      No Known Allergies  Social History   Socioeconomic History    Marital status: Single    Spouse name: Not on file   Number of children: Not on file   Years of education: Not on file   Highest education level: Not on file  Occupational History   Not on file  Tobacco Use   Smoking status: Every Day    Packs/day: 1.00    Years: 25.00    Pack years: 25.00    Types: Cigarettes, Cigars    Last attempt to quit: 08/04/2012    Years since quitting: 8.4   Smokeless tobacco: Former   Tobacco comments:    using patch and zyban -trying to quit  Vaping Use   Vaping Use: Never used  Substance and Sexual Activity   Alcohol use: Yes    Comment: rarely   Drug use: No   Sexual activity: Not on file  Other Topics Concern   Not on file  Social History Narrative   Not on file   Social Determinants of Health   Financial Resource Strain: Not on file  Food Insecurity: Not on file  Transportation Needs: Not on file  Physical Activity: Not on file  Stress: Not on file  Social Connections: Not on file  Intimate Partner Violence: Not on file    Family History  Problem Relation Age of Onset   Heart disease Father 81       CAD   Arthritis Mother    Hypertension Other  Cancer Other        grandparent, lung   Stroke Other        grandparent    There were no vitals taken for this visit.  No flowsheet data found.  No flowsheet data found.  Review of Systems: See HPI above.     Objective:  Physical Exam:  Gen: NAD, comfortable in exam room  Right foot: Tender medial calcaneus at plantar fascia insertion.   Assessment & Plan:  1. Right plantar fasciitis - using strassburg sock, doing home exercises and stretches, shoes with good arch support.  Second ecswt treatment given today - follow up in 1 week for third treatment - consider 3-4 total treatments.  Procedure: ECSWT Indications:  Right plantar fasciitis   Procedure Details Consent: Risks of procedure as well as the alternatives and risks of each were explained to the patient.  Written  consent for procedure obtained. Time Out: Verified patient identification, verified procedure, site was marked, verified correct patient position, medications/allergies/relevent history reviewed.  The area was cleaned with alcohol swab.     The right plantar fascia was targeted for Extracorporeal shockwave therapy.    Preset: plantar fascia Power Level: 80 Frequency: 9 Impulse/cycles: 2500 Head size: large   Patient tolerated procedure well without immediate complications

## 2021-01-09 ENCOUNTER — Ambulatory Visit (INDEPENDENT_AMBULATORY_CARE_PROVIDER_SITE_OTHER): Payer: Self-pay | Admitting: Family Medicine

## 2021-01-09 ENCOUNTER — Other Ambulatory Visit: Payer: Self-pay

## 2021-01-09 DIAGNOSIS — M722 Plantar fascial fibromatosis: Secondary | ICD-10-CM

## 2021-01-09 NOTE — Assessment & Plan Note (Signed)
Doing well with shockwave therapy.  We will continue Strassburg sock, home exercises, stretches, shoes with good arch support.  He was given his third treatment today.  He will consider another treatment after he attempts to round this weekend.

## 2021-01-09 NOTE — Progress Notes (Signed)
   Brett Morgan is a 50 y.o. male who presents to [facility] today for the following:  Right foot plantar fasciitis He is presenting today for his third ECSW treatment on his right plantar fascia He reports that since his last visit he has been doing very well and overall happy with his progress He has not had any adverse outcomes  PMH reviewed.  ROS as above. Medications reviewed.  Exam:  There were no vitals taken for this visit. Gen: Well NAD MSK:  Right foot: He is tender at the medial calcaneus at the plantar fascia origin  No results found.   Assessment and Plan: 1) Plantar fasciitis Doing well with shockwave therapy.  We will continue Strassburg sock, home exercises, stretches, shoes with good arch support.  He was given his third treatment today.  He will consider another treatment after he attempts to round this weekend.   Procedure: ECSWT Indications:  plantar fasciitis   Procedure Details Consent: Risks of procedure as well as the alternatives and risks of each were explained to the patient.  Written consent for procedure obtained. Time Out: Verified patient identification, verified procedure, site was marked, verified correct patient position, medications/allergies/relevent history reviewed.  The area was cleaned with alcohol swab.     The right plantar fascia was targeted for Extracorporeal shockwave therapy.    Preset: plantar fasciitis Power Level: 90 Frequency: 10 Impulse/cycles: 2500 Head size: large   Patient tolerated procedure well without immediate complications     Arizona Constable, D.O.  PGY-4 [facility]  01/09/2021 3:03 PM  Addendum:  Patient seen in the office by fellow.  Her history, exam, plan of care were precepted with me.  Brett Lemon MD Kirt Boys

## 2021-01-30 ENCOUNTER — Ambulatory Visit: Payer: Self-pay

## 2021-01-30 ENCOUNTER — Encounter: Payer: Self-pay | Admitting: Family Medicine

## 2021-01-30 ENCOUNTER — Other Ambulatory Visit: Payer: Self-pay

## 2021-01-30 ENCOUNTER — Ambulatory Visit: Payer: 59 | Admitting: Family Medicine

## 2021-01-30 VITALS — Ht 71.0 in | Wt 231.0 lb

## 2021-01-30 DIAGNOSIS — M722 Plantar fascial fibromatosis: Secondary | ICD-10-CM | POA: Diagnosis not present

## 2021-01-30 MED ORDER — METHYLPREDNISOLONE ACETATE 40 MG/ML IJ SUSP
40.0000 mg | Freq: Once | INTRAMUSCULAR | Status: AC
  Administered 2021-01-30: 40 mg

## 2021-01-30 NOTE — Patient Instructions (Signed)
Nice to meet you Please try ice  Please try the exercises   Please send me a message in MyChart with any questions or updates.  Please see me back in 4 weeks.   --Dr. Shashana Fullington  

## 2021-01-30 NOTE — Progress Notes (Signed)
Brett Morgan - 50 y.o. male MRN [id number]  Date of birth: [birth year]  SUBJECTIVE:  Including CC & ROS.  No chief complaint on file.   Brett Morgan is a 50 y.o. male that is presenting with acute on chronic right foot pain.  Pain is been ongoing for several months.  Has tried shockwave therapy and home exercises with limited improvement..    Review of Systems See HPI   HISTORY: Past Medical, Surgical, Social, and Family History Reviewed & Updated per EMR.   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   Personal history of urinary calculi 04/18/2009   Qualifier: Diagnosis of  By: Valma Cava LPN, Izora Gala      Past Surgical History:  Procedure Laterality Date   APPENDECTOMY     KNEE SURGERY     arthroscopic left   LAPAROSCOPIC APPENDECTOMY N/A 02/16/2013   Procedure: APPENDECTOMY LAPAROSCOPIC;  Surgeon: Leighton Ruff, MD;  Location: [facility];  Service: General;  Laterality: N/A;   LUMBAR LAMINECTOMY/DECOMPRESSION MICRODISCECTOMY  10/16/2011   Procedure: LUMBAR LAMINECTOMY/DECOMPRESSION MICRODISCECTOMY;  Surgeon: Johnn Hai, MD;  Location: [facility];  Service: Orthopedics;  Laterality: Right;  Micro Lumbar Decompression L4-L5 on Right   TONSILLECTOMY     TONSILLECTOMY      Family History  Problem Relation Age of Onset   Heart disease Father 55       CAD   Arthritis Mother    Hypertension Other    Cancer Other        grandparent, lung   Stroke Other        grandparent    Social History   Socioeconomic History   Marital status: Single    Spouse name: Not on file   Number of children: Not on file   Years of education: Not on file   Highest education level: Not on file  Occupational History   Not on file  Tobacco Use   Smoking status: Every Day     Packs/day: 1.00    Years: 25.00    Pack years: 25.00    Types: Cigarettes, Cigars    Last attempt to quit: 08/04/2012    Years since quitting: 8.4   Smokeless tobacco: Former   Tobacco comments:    using patch and zyban -trying to quit  Vaping Use   Vaping Use: Never used  Substance and Sexual Activity   Alcohol use: Yes    Comment: rarely   Drug use: No   Sexual activity: Not on file  Other Topics Concern   Not on file  Social History Narrative   Not on file   Social Determinants of Health   Financial Resource Strain: Not on file  Food Insecurity: Not on file  Transportation Needs: Not on file  Physical Activity: Not on file  Stress: Not on file  Social Connections: Not on file  Intimate Partner Violence: Not on file     PHYSICAL EXAM:  VS: Ht '5\' 11"'$  (1.803 m)   Wt 231 lb (104.8 kg)   BMI 32.22 kg/m  Physical Exam Gen: NAD, alert, cooperative with exam, well-appearing   Limited ultrasound: Right foot:  Significantly thickened right plantar fascia with hypoechoic change and thickened diameter when compared  to the contralateral side.  Summary: Planter fasciitis  Ultrasound and interpretation by Clearance Coots, MD   Aspiration/Injection Procedure Note ELYJIAH Morgan 31-Jan-1971  Procedure: Injection Indications: Right foot pain  Procedure Details Consent: Risks of procedure as well as the alternatives and risks of each were explained to the (patient/caregiver).  Consent for procedure obtained. Time Out: Verified patient identification, verified procedure, site/side was marked, verified correct patient position, special equipment/implants available, medications/allergies/relevent history reviewed, required imaging and test results available.  Performed.  The area was cleaned with iodine and alcohol swabs.    The right plantar fascia was injected using 1 cc's of 40 mg Depo-Medrol and 2 cc's of 0.25% bupivacaine with a 25 1 1/2" needle.  Ultrasound was used. Images  were obtained in long views showing the injection.     A sterile dressing was applied.  Patient did tolerate procedure well.     ASSESSMENT & PLAN:   Plantar fasciitis of right foot Significant thickening and mucous changes of the plantar fascia on the right. -Counseled on home exercise therapy and supportive care. -Midfoot arch strap. -Injection today. -Could consider physical therapy

## 2021-01-30 NOTE — Assessment & Plan Note (Signed)
Significant thickening and mucous changes of the plantar fascia on the right. -Counseled on home exercise therapy and supportive care. -Midfoot arch strap. -Injection today. -Could consider physical therapy

## 2021-04-23 ENCOUNTER — Ambulatory Visit (HOSPITAL_COMMUNITY): Admit: 2021-04-23 | Payer: 59 | Source: Home / Self Care

## 2022-04-29 ENCOUNTER — Ambulatory Visit (INDEPENDENT_AMBULATORY_CARE_PROVIDER_SITE_OTHER): Payer: Managed Care, Other (non HMO) | Admitting: *Deleted

## 2022-04-29 DIAGNOSIS — Z23 Encounter for immunization: Secondary | ICD-10-CM

## 2022-08-01 ENCOUNTER — Encounter: Payer: Self-pay | Admitting: Family Medicine

## 2022-08-01 ENCOUNTER — Ambulatory Visit (INDEPENDENT_AMBULATORY_CARE_PROVIDER_SITE_OTHER): Payer: Managed Care, Other (non HMO) | Admitting: Family Medicine

## 2022-08-01 VITALS — BP 134/70 | HR 80 | Temp 97.6°F | Ht 71.0 in | Wt 240.1 lb

## 2022-08-01 DIAGNOSIS — Z0000 Encounter for general adult medical examination without abnormal findings: Secondary | ICD-10-CM

## 2022-08-01 DIAGNOSIS — E785 Hyperlipidemia, unspecified: Secondary | ICD-10-CM

## 2022-08-01 DIAGNOSIS — Z1211 Encounter for screening for malignant neoplasm of colon: Secondary | ICD-10-CM | POA: Diagnosis not present

## 2022-08-01 LAB — CBC WITH DIFFERENTIAL/PLATELET
Basophils Absolute: 0 10*3/uL (ref 0.0–0.1)
Basophils Relative: 0.5 % (ref 0.0–3.0)
Eosinophils Absolute: 0.2 10*3/uL (ref 0.0–0.7)
Eosinophils Relative: 2.8 % (ref 0.0–5.0)
HCT: 42 % (ref 39.0–52.0)
Hemoglobin: 14.4 g/dL (ref 13.0–17.0)
Lymphocytes Relative: 41.4 % (ref 12.0–46.0)
Lymphs Abs: 2.3 10*3/uL (ref 0.7–4.0)
MCHC: 34.3 g/dL (ref 30.0–36.0)
MCV: 91.8 fl (ref 78.0–100.0)
Monocytes Absolute: 0.5 10*3/uL (ref 0.1–1.0)
Monocytes Relative: 8.5 % (ref 3.0–12.0)
Neutro Abs: 2.6 10*3/uL (ref 1.4–7.7)
Neutrophils Relative %: 46.8 % (ref 43.0–77.0)
Platelets: 193 10*3/uL (ref 150.0–400.0)
RBC: 4.57 Mil/uL (ref 4.22–5.81)
RDW: 12.8 % (ref 11.5–15.5)
WBC: 5.5 10*3/uL (ref 4.0–10.5)

## 2022-08-01 LAB — HEPATIC FUNCTION PANEL
ALT: 17 U/L (ref 0–53)
AST: 16 U/L (ref 0–37)
Albumin: 4.1 g/dL (ref 3.5–5.2)
Alkaline Phosphatase: 72 U/L (ref 39–117)
Bilirubin, Direct: 0 mg/dL (ref 0.0–0.3)
Total Bilirubin: 0.3 mg/dL (ref 0.2–1.2)
Total Protein: 6.8 g/dL (ref 6.0–8.3)

## 2022-08-01 LAB — PSA: PSA: 1.15 ng/mL (ref 0.10–4.00)

## 2022-08-01 LAB — BASIC METABOLIC PANEL
BUN: 19 mg/dL (ref 6–23)
CO2: 27 mEq/L (ref 19–32)
Calcium: 9.3 mg/dL (ref 8.4–10.5)
Chloride: 104 mEq/L (ref 96–112)
Creatinine, Ser: 0.9 mg/dL (ref 0.40–1.50)
GFR: 99.56 mL/min (ref >60.00)
Glucose, Bld: 102 mg/dL — ABNORMAL HIGH (ref 70–99)
Potassium: 4.3 mEq/L (ref 3.5–5.1)
Sodium: 140 mEq/L (ref 135–145)

## 2022-08-01 LAB — LIPID PANEL
Cholesterol: 225 mg/dL — ABNORMAL HIGH (ref 0–200)
HDL: 34 mg/dL — ABNORMAL LOW (ref >39.00)
LDL Cholesterol: 173 mg/dL — ABNORMAL HIGH (ref 0–99)
NonHDL: 191.04
Total CHOL/HDL Ratio: 7
Triglycerides: 88 mg/dL (ref 0.0–149.0)
VLDL: 17.6 mg/dL (ref 0.0–40.0)

## 2022-08-01 MED ORDER — VENTOLIN HFA 108 (90 BASE) MCG/ACT IN AERS: INHALATION_SPRAY | RESPIRATORY_TRACT | 2 refills | Status: AC

## 2022-08-01 NOTE — Progress Notes (Signed)
Established Patient Office Visit  Subjective   Patient ID: Brett Morgan, male    DOB: 1971/01/05  Age: 52 y.o. MRN: [id number]  Chief Complaint  Patient presents with   Annual Exam    HPI   Brett Morgan is here for physical.  He has history of obesity, and mild intermittent asthma, past history of nicotine use, mild hyperlipidemia.  He quit smoking about a year ago.  Does have over 25-pack-year history.  He had coronary calcium score 44 which is mostly right coronary couple years ago.  Tries to watch saturated fats.  His father had coronary disease age 48.  He is currently doing some resistance training for exercise but no active aerobic exercise.  He hopes to lose some weight soon.  Health maintenance reviewed  -Just turned 30 with no prior history of colon cancer screening.  He is willing to set up colonoscopy -He is a candidate for lung cancer screening with over 25-pack-year history and quit 1 year ago and age 65 -No history of Shingrix and he will consider after checking on insurance coverage  Family history-Father had history of coronary disease age 30.  His mother is alive and well.  No known family history of diabetes.   Social history-  Quit smoking about a year ago he is single.  No children.  Run some for exercise.  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  Scientist, product/process development football at [employer]  The 10-year ASCVD risk score (Arnett DK, et al., 2019) is: 14.3%   Values used to calculate the score:     Age: 72 years     Sex: Male     Is Non-Hispanic African American: No     Diabetic: No     Tobacco smoker: Yes     Systolic Blood Pressure: Q000111Q mmHg     Is BP treated: No     HDL Cholesterol: 34 mg/dL     Total Cholesterol: 225 mg/dL   Past Medical History:  Diagnosis Date   ASTHMA 04/18/2009   Qualifier: Diagnosis of  By: Valma Cava LPN, Jamal Maes 06/06/2010   Qualifier: Diagnosis of  By: Elease Hashimoto MD, Lelah Rennaker     Lumbar pain    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   Personal history of urinary calculi 04/18/2009   Qualifier: Diagnosis of  By: Valma Cava LPN, Izora Gala     Past Surgical History:  Procedure Laterality Date   APPENDECTOMY     KNEE SURGERY     arthroscopic left   LAPAROSCOPIC APPENDECTOMY N/A 02/16/2013   Procedure: APPENDECTOMY LAPAROSCOPIC;  Surgeon: Leighton Ruff, MD;  Location: [facility];  Service: General;  Laterality: N/A;   LUMBAR LAMINECTOMY/DECOMPRESSION MICRODISCECTOMY  10/16/2011   Procedure: LUMBAR LAMINECTOMY/DECOMPRESSION MICRODISCECTOMY;  Surgeon: Johnn Hai, MD;  Location: [facility];  Service: Orthopedics;  Laterality: Right;  Micro Lumbar Decompression L4-L5 on Right   TONSILLECTOMY     TONSILLECTOMY      reports that he has been smoking cigarettes and cigars. He has a 25.00 pack-year smoking history. He has quit using smokeless tobacco. He reports current alcohol use. He reports that he does not use drugs. family history includes Arthritis in his mother; Cancer in an other family member; Heart disease (age of onset: 32) in his father; Hypertension in an other family member; Stroke in an other family member. No Known Allergies  Review of Systems  Constitutional:  Negative for chills, fever, malaise/fatigue and weight  loss.  HENT:  Negative for hearing loss.   Eyes:  Negative for blurred vision and double vision.  Respiratory:  Negative for cough and shortness of breath.   Cardiovascular:  Negative for chest pain, palpitations and leg swelling.  Gastrointestinal:  Negative for abdominal pain, blood in stool, constipation and diarrhea.  Genitourinary:  Negative for dysuria.  Skin:  Negative for rash.  Neurological:  Negative for dizziness, speech change, seizures, loss of consciousness and headaches.  Psychiatric/Behavioral:  Negative for depression.       Objective:     BP 134/70 (BP Location: Left Arm, Patient Position: Sitting, Cuff Size: Large)   Pulse 80   Temp 97.6 F (36.4 C) (Oral)   Ht '5\' 11"'$  (1.803  m)   Wt 240 lb 1.6 oz (108.9 kg)   SpO2 97%   BMI 33.49 kg/m    Physical Exam Vitals reviewed.  Constitutional:      General: He is not in acute distress.    Appearance: He is well-developed. He is not ill-appearing.  HENT:     Head: Normocephalic and atraumatic.     Right Ear: External ear normal.     Left Ear: External ear normal.  Eyes:     Conjunctiva/sclera: Conjunctivae normal.     Pupils: Pupils are equal, round, and reactive to light.  Neck:     Thyroid: No thyromegaly.  Cardiovascular:     Rate and Rhythm: Normal rate and regular rhythm.     Heart sounds: Normal heart sounds. No murmur heard. Pulmonary:     Effort: No respiratory distress.     Breath sounds: No wheezing or rales.  Abdominal:     General: Bowel sounds are normal. There is no distension.     Palpations: Abdomen is soft. There is no mass.     Tenderness: There is no abdominal tenderness. There is no guarding or rebound.  Musculoskeletal:     Cervical back: Normal range of motion and neck supple.     Right lower leg: No edema.     Left lower leg: No edema.  Lymphadenopathy:     Cervical: No cervical adenopathy.  Skin:    Findings: No rash.  Neurological:     Mental Status: He is alert and oriented to person, place, and time.     Cranial Nerves: No cranial nerve deficit.      No results found for any visits on 08/01/22.    The 10-year ASCVD risk score (Arnett DK, et al., 2019) is: 12%    Assessment & Plan:   Problem List Items Addressed This Visit   None Visit Diagnoses     Colon cancer screening    -  Primary   Relevant Orders   Ambulatory referral to Gastroenterology   Ambulatory Referral Lung Cancer Screening [facility]   Physical exam       Relevant Orders   Basic metabolic panel   Lipid panel   CBC with Differential/Platelet   Hepatic function panel   PSA     Brett Morgan is here for physical exam.  We discussed the following health maintenance items  -Consider Shingrix  vaccine.  He will check on insurance coverage -We discussed setting up low-dose CT lung cancer screening.  He is interested and does appear to be a candidate based on age, quit a year ago and pack-year history -Set up initial screening colonoscopy -Check labs as above including PSA -Strongly recommend he try to lose some weight.  No follow-ups  on file.    Carolann Littler, MD

## 2022-08-01 NOTE — Patient Instructions (Addendum)
Try to lose some weight- as discussed  We are setting up colonoscopy referral and lung cancer screen.   Consider Shingrix vaccine.

## 2022-08-04 ENCOUNTER — Telehealth: Payer: Self-pay | Admitting: Internal Medicine

## 2022-08-04 ENCOUNTER — Encounter: Payer: Self-pay | Admitting: Family Medicine

## 2022-08-04 DIAGNOSIS — E785 Hyperlipidemia, unspecified: Secondary | ICD-10-CM

## 2022-08-04 MED ORDER — ROSUVASTATIN CALCIUM 20 MG PO TABS: ORAL_TABLET | ORAL | 0 refills | Status: DC

## 2022-08-04 NOTE — Telephone Encounter (Signed)
Can you assist me with this please, thank you.

## 2022-08-04 NOTE — Telephone Encounter (Signed)
Patient requesting screening colonoscopy with me  Please arrange direct colonoscopy  Not urgent  He says PCP has made a referral

## 2022-08-04 NOTE — Addendum Note (Signed)
Addended by: Nilda Riggs on: 08/04/2022 09:18 AM   Modules accepted: Orders

## 2022-08-06 ENCOUNTER — Encounter: Payer: Self-pay | Admitting: Internal Medicine

## 2022-08-06 NOTE — Telephone Encounter (Signed)
Left message for patient to call back and schedule direct colon.

## 2022-08-07 ENCOUNTER — Other Ambulatory Visit: Payer: Self-pay

## 2022-08-07 DIAGNOSIS — Z122 Encounter for screening for malignant neoplasm of respiratory organs: Secondary | ICD-10-CM

## 2022-08-07 DIAGNOSIS — Z87891 Personal history of nicotine dependence: Secondary | ICD-10-CM

## 2022-08-22 ENCOUNTER — Ambulatory Visit (INDEPENDENT_AMBULATORY_CARE_PROVIDER_SITE_OTHER): Payer: Managed Care, Other (non HMO) | Admitting: Physician Assistant

## 2022-08-22 ENCOUNTER — Encounter: Payer: Self-pay | Admitting: Physician Assistant

## 2022-08-22 DIAGNOSIS — Z87891 Personal history of nicotine dependence: Secondary | ICD-10-CM | POA: Diagnosis not present

## 2022-08-22 NOTE — Patient Instructions (Signed)
Thank you for participating in the [facility]. It was our pleasure to meet you today. We will call you with the results of your scan within the next few days. Your scan will be assigned a Lung RADS category score by the physicians reading the scans.  This Lung RADS score determines follow up scanning.  See below for description of categories, and follow up screening recommendations. We will be in touch to schedule your follow up screening annually or based on recommendations of our providers. We will fax a copy of your scan results to your Primary Care Physician, or the physician who referred you to the program, to ensure they have the results. Please call the office if you have any questions or concerns regarding your scanning experience or results.  Our office number is [phone]. Please speak with Denise Phelps, RN. , or  Denise Buckner RN, They are  our Lung Cancer Screening RN.'s If They are unavailable when you call, Please leave a message on the voice mail. We will return your call at our earliest convenience.This voice mail is monitored several times a day.  Remember, if your scan is normal, we will scan you annually as long as you continue to meet the criteria for the program. (Age 50-80, Current smoker or smoker who has quit within the last 15 years). If you are a smoker, remember, quitting is the single most powerful action that you can take to decrease your risk of lung cancer and other pulmonary, breathing related problems. We know quitting is hard, and we are here to help.  Please let us know if there is anything we can do to help you meet your goal of quitting. If you are a former smoker, congratulations. We are proud of you! Remain smoke free! Remember you can refer friends or family members through the number above.  We will screen them to make sure they meet criteria for the program. Thank you for helping us take better care of you by  participating in Lung Screening.  You can receive free nicotine replacement therapy ( patches, gum or mints) by calling 1-800-QUIT NOW. Please call so we can get you on the path to becoming  a non-smoker. I know it is hard, but you can do this!  Lung RADS Categories:  Lung RADS 1: no nodules or definitely non-concerning nodules.  Recommendation is for a repeat annual scan in 12 months.  Lung RADS 2:  nodules that are non-concerning in appearance and behavior with a very low likelihood of becoming an active cancer. Recommendation is for a repeat annual scan in 12 months.  Lung RADS 3: nodules that are probably non-concerning , includes nodules with a low likelihood of becoming an active cancer.  Recommendation is for a 6-month repeat screening scan. Often noted after an upper respiratory illness. We will be in touch to make sure you have no questions, and to schedule your 6-month scan.  Lung RADS 4 A: nodules with concerning findings, recommendation is most often for a follow up scan in 3 months or additional testing based on our provider's assessment of the scan. We will be in touch to make sure you have no questions and to schedule the recommended 3 month follow up scan.  Lung RADS 4 B:  indicates findings that are concerning. We will be in touch with you to schedule additional diagnostic testing based on our provider's  assessment of the scan.  Other options for assistance in smoking cessation (   As covered by your insurance benefits)  Hypnosis for smoking cessation  [facility] [phone]  Acupuncture for smoking cessation  [facility] [phone]   

## 2022-08-22 NOTE — Progress Notes (Signed)
Virtual Visit via Telephone Note  I connected with Brett Morgan on 08/22/22 at 10:00 AM EDT by telephone and verified that I am speaking with the correct person using two identifiers.  Location: Patient: home Provider: working virtually from home   I discussed the limitations, risks, security and privacy concerns of performing an evaluation and management service by telephone and the availability of in person appointments. I also discussed with the patient that there may be a patient responsible charge related to this service. The patient expressed understanding and agreed to proceed.      Shared Decision Making Visit Lung Cancer Screening Program [phone])   Eligibility: Age 52 y.o. Pack Years Smoking History 32  (# packs/per year x # years smoked) Recent History of coughing up blood  no Unexplained weight loss? no ( >Than 15 pounds within the last 6 months ) Prior History Lung / other cancer no (Diagnosis within the last 5 years already requiring surveillance chest CT Scans). Smoking Status Former Smoker Former Smokers: Years since quit: < 1 year  Quit Date: 2023  Visit Components: Discussion included one or more decision making aids. yes Discussion included risk/benefits of screening. yes Discussion included potential follow up diagnostic testing for abnormal scans. yes Discussion included meaning and risk of over diagnosis. yes Discussion included meaning and risk of False Positives. yes Discussion included meaning of total radiation exposure. yes  Counseling Included: Importance of adherence to annual lung cancer LDCT screening. yes Impact of comorbidities on ability to participate in the program. yes Ability and willingness to under diagnostic treatment. yes  Smoking Cessation Counseling: Former Smokers:  Discussed the importance of maintaining cigarette abstinence. yes Diagnosis Code: Personal History of Nicotine Dependence. [id number] Information about tobacco  cessation classes and interventions provided to patient. Yes Patient provided with "ticket" for LDCT Scan. N/a Written Order for Lung Cancer Screening with LDCT placed in Epic. Yes (CT Chest Lung Cancer Screening Low Dose W/O CM) [id number] Z12.2-Screening of respiratory organs Z87.891-Personal history of nicotine dependence    I spent 25 minutes of face to face time/virtual visit time  with the pateint discussing the risks and benefits of lung cancer screening. We took the time to pause the power point at intervals to allow for questions to be asked and answered to ensure understanding. We discussed that he had taken the single most powerful action possible to decrease his risk of developing lung cancer when he quit smoking. I counseled him to remain smoke free, and to contact me if he ever had the desire to smoke again so that I can provide resources and tools to help support the effort to remain smoke free. We discussed the time and location of the scan, and that either  Doroteo Glassman RN, Joella Prince, RN or I  or I will call / send a letter with the results within  24-72 hours of receiving them. He has the office contact information in the event he needs to speak with me,  he verbalized understanding of all of the above and had no further questions upon leaving the office.     I explained to the patient that there has been a high incidence of coronary artery disease noted on these exams. I explained that this is a non-gated exam therefore degree or severity cannot be determined. This patient is on statin therapy. I have asked the patient to follow-up with their PCP regarding any incidental finding of coronary artery disease and management with diet or medication as  they feel is clinically indicated. The patient verbalized understanding of the above and had no further questions.     Otilio Carpen Tiny Rietz, PA-C

## 2022-08-26 ENCOUNTER — Ambulatory Visit
Admission: RE | Admit: 2022-08-26 | Discharge: 2022-08-26 | Disposition: A | Discharge status: Home / Self Care | Payer: Managed Care, Other (non HMO) | Source: Ambulatory Visit | Attending: Acute Care | Admitting: Acute Care

## 2022-08-26 ENCOUNTER — Other Ambulatory Visit: Payer: Managed Care, Other (non HMO)

## 2022-08-26 DIAGNOSIS — Z87891 Personal history of nicotine dependence: Secondary | ICD-10-CM

## 2022-08-26 DIAGNOSIS — Z122 Encounter for screening for malignant neoplasm of respiratory organs: Secondary | ICD-10-CM

## 2022-08-27 ENCOUNTER — Encounter: Payer: Self-pay | Admitting: Internal Medicine

## 2022-08-27 ENCOUNTER — Ambulatory Visit (AMBULATORY_SURGERY_CENTER): Payer: Managed Care, Other (non HMO)

## 2022-08-27 VITALS — Ht 71.0 in | Wt 239.6 lb

## 2022-08-27 DIAGNOSIS — Z1211 Encounter for screening for malignant neoplasm of colon: Secondary | ICD-10-CM

## 2022-08-27 NOTE — Progress Notes (Signed)

## 2022-08-28 ENCOUNTER — Other Ambulatory Visit: Payer: Self-pay

## 2022-08-28 DIAGNOSIS — Z87891 Personal history of nicotine dependence: Secondary | ICD-10-CM

## 2022-08-28 DIAGNOSIS — Z122 Encounter for screening for malignant neoplasm of respiratory organs: Secondary | ICD-10-CM

## 2022-09-02 ENCOUNTER — Encounter: Payer: Self-pay | Admitting: Family Medicine

## 2022-09-09 ENCOUNTER — Encounter: Payer: Self-pay | Admitting: Certified Registered Nurse Anesthetist

## 2022-09-10 ENCOUNTER — Ambulatory Visit (AMBULATORY_SURGERY_CENTER): Payer: 59 | Admitting: Internal Medicine

## 2022-09-10 ENCOUNTER — Encounter: Payer: Self-pay | Admitting: Internal Medicine

## 2022-09-10 VITALS — BP 106/73 | HR 59 | Temp 98.6°F | Resp 13 | Ht 71.0 in | Wt 239.6 lb

## 2022-09-10 DIAGNOSIS — Z1211 Encounter for screening for malignant neoplasm of colon: Secondary | ICD-10-CM | POA: Diagnosis present

## 2022-09-10 DIAGNOSIS — D120 Benign neoplasm of cecum: Secondary | ICD-10-CM

## 2022-09-10 DIAGNOSIS — D122 Benign neoplasm of ascending colon: Secondary | ICD-10-CM

## 2022-09-10 DIAGNOSIS — D123 Benign neoplasm of transverse colon: Secondary | ICD-10-CM

## 2022-09-10 DIAGNOSIS — D125 Benign neoplasm of sigmoid colon: Secondary | ICD-10-CM | POA: Diagnosis not present

## 2022-09-10 DIAGNOSIS — D1391 Familial adenomatous polyposis: Secondary | ICD-10-CM | POA: Insufficient documentation

## 2022-09-10 HISTORY — PX: COLONOSCOPY W/ POLYPECTOMY: SHX1380

## 2022-09-10 HISTORY — DX: Familial adenomatous polyposis: D13.91

## 2022-09-10 MED ORDER — SODIUM CHLORIDE 0.9 % IV SOLN: 500.0000 mL | Freq: Once | INTRAVENOUS | Status: DC

## 2022-09-10 NOTE — Patient Instructions (Addendum)
I found and removed 20 polyps today small to medium in size and all look benign. I will let you know pathology results and when to have another routine colonoscopy by mail and/or My Chart.  Anticipate repeating in 1 year.  You also have a condition called diverticulosis - common and not usually a problem. Please read the handout provided.  I appreciate the opportunity to care for you. [name], MD, FACG  YOU HAD AN ENDOSCOPIC PROCEDURE TODAY AT THE [facility]:   Refer to the procedure report that was given to you for any specific questions about what was found during the examination.  If the procedure report does not answer your questions, please call your gastroenterologist to clarify.  If you requested that your care partner not be given the details of your procedure findings, then the procedure report has been included in a sealed envelope for you to review at your convenience later.  YOU SHOULD EXPECT: Some feelings of bloating in the abdomen. Passage of more gas than usual.  Walking can help get rid of the air that was put into your GI tract during the procedure and reduce the bloating. If you had a lower endoscopy (such as a colonoscopy or flexible sigmoidoscopy) you may notice spotting of blood in your stool or on the toilet paper. If you underwent a bowel prep for your procedure, you may not have a normal bowel movement for a few days.  Please Note:  You might notice some irritation and congestion in your nose or some drainage.  This is from the oxygen used during your procedure.  There is no need for concern and it should clear up in a day or so.  SYMPTOMS TO REPORT IMMEDIATELY:  Following lower endoscopy (colonoscopy or flexible sigmoidoscopy):  Excessive amounts of blood in the stool  Significant tenderness or worsening of abdominal pains  Swelling of the abdomen that is new, acute  Fever of 100F or higher  For urgent or emergent issues, a  gastroenterologist can be reached at any hour by calling [phone]. Do not use MyChart messaging for urgent concerns.    DIET:  We do recommend a small meal at first, but then you may proceed to your regular diet.  Drink plenty of fluids but you should avoid alcoholic beverages for 24 hours.  ACTIVITY:  You should plan to take it easy for the rest of today and you should NOT DRIVE or use heavy machinery until tomorrow (because of the sedation medicines used during the test).    FOLLOW UP: Our staff will call the number listed on your records the next business day following your procedure.  We will call around 7:15- 8:00 am to check on you and address any questions or concerns that you may have regarding the information given to you following your procedure. If we do not reach you, we will leave a message.     If any biopsies were taken you will be contacted by phone or by letter within the next 1-3 weeks.  Please call us at [phone] if you have not heard about the biopsies in 3 weeks.    SIGNATURES/CONFIDENTIALITY: You and/or your care partner have signed paperwork which will be entered into your electronic medical record.  These signatures attest to the fact that that the information above on your After Visit Summary has been reviewed and is understood.  Full responsibility of the confidentiality of this discharge information lies with you and/or  your care-partner. 

## 2022-09-10 NOTE — Progress Notes (Signed)
[facility] History and Physical   Primary Care Physician:  Brett CoveyBurchette, Morgan Morgan, Morgan   Reason for Procedure:   CRCA screen  Plan:    colonoscopy     HPI: Brett Morgan is a 52 y.o. 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   Personal history of urinary calculi 04/18/2009   Qualifier: Diagnosis of  By: Brett Morgan      Past Surgical History:  Procedure Laterality Date   APPENDECTOMY     KNEE SURGERY     arthroscopic left   LAPAROSCOPIC APPENDECTOMY N/A 02/16/2013   Procedure: APPENDECTOMY LAPAROSCOPIC;  Surgeon: Brett Morgan;  Location: [facility];  Service: General;  Laterality: N/A;   LUMBAR LAMINECTOMY/DECOMPRESSION MICRODISCECTOMY  10/16/2011   Procedure: LUMBAR LAMINECTOMY/DECOMPRESSION MICRODISCECTOMY;  Surgeon: Brett Morgan;  Location: [facility];  Service: Orthopedics;  Laterality: Right;  Micro Lumbar Decompression L4-L5 on Right   TONSILLECTOMY     TONSILLECTOMY      Prior to Admission medications   Medication Sig Start Date End Date Taking? Authorizing Provider  acetaminophen (TYLENOL) 325 MG tablet Take 650 mg by mouth every 6 (six) hours as needed for headache.   Yes Provider, Historical, Morgan  clobetasol cream (TEMOVATE) 0.05 % Apply 1 Application topically 2 (two) times daily as needed. 08/05/22  Yes Provider, Historical, Morgan  Multiple Vitamin (MULITIVITAMIN WITH MINERALS) TABS Take 1 tablet by mouth daily.   Yes Provider, Historical, Morgan  Omega-3 Fatty Acids (FISH OIL) 1000 MG CAPS Take 1,000 mg by mouth daily. 07/03/22  Yes Provider, Historical, Morgan  Plant Sterol Stanol-Pantethine 450-75 MG TABS Take 2 tablets by mouth 2 (two) times daily.    Yes Provider,  Historical, Morgan  rosuvastatin (CRESTOR) 20 MG tablet Take 1 tablet (20 mg total) by mouth once daily. 08/04/22  Yes Brett Morgan  Turmeric 500 MG CAPS Take 2 capsules by mouth daily. 07/03/22  Yes Provider, Historical, Morgan  methocarbamol (ROBAXIN) 500 MG tablet Take 500 mg by mouth every 8 (eight) hours as needed. 11/16/19   Provider, Historical, Morgan  oxyCODONE-acetaminophen (PERCOCET) 5-325 MG tablet Take 1 tablet by mouth 2 (two) times daily as needed. 07/13/17   Provider, Historical, Morgan  VENTOLIN HFA 108 (90 Base) MCG/ACT inhaler INHALE 1 TO 2 PUFFS BY MOUTH EVERY 4 HOURS AS NEEDED FOR WHEEZING OR SHORTNESS OF BREATH Patient not taking: Reported on 08/27/2022 08/01/22   Brett CoveyBurchette, Morgan Morgan, Morgan    Current Outpatient Medications  Medication Sig Dispense Refill   acetaminophen (TYLENOL) 325 MG tablet Take 650 mg by mouth every 6 (six) hours as needed for headache.     clobetasol cream (TEMOVATE) 0.05 % Apply 1 Application topically 2 (two) times daily as needed.     Multiple Vitamin (MULITIVITAMIN WITH MINERALS) TABS Take 1 tablet by mouth daily.     Omega-3 Fatty Acids (FISH OIL) 1000 MG CAPS Take 1,000 mg by mouth daily.     Plant Sterol Stanol-Pantethine 450-75 MG TABS Take 2 tablets by mouth 2 (two) times daily.      rosuvastatin (CRESTOR) 20 MG tablet Take 1  tablet (20 mg total) by mouth once daily. 90 tablet 0   Turmeric 500 MG CAPS Take 2 capsules by mouth daily.     methocarbamol (ROBAXIN) 500 MG tablet Take 500 mg by mouth every 8 (eight) hours as needed.     oxyCODONE-acetaminophen (PERCOCET) 5-325 MG tablet Take 1 tablet by mouth 2 (two) times daily as needed.     VENTOLIN HFA 108 (90 Base) MCG/ACT inhaler INHALE 1 TO 2 PUFFS BY MOUTH EVERY 4 HOURS AS NEEDED FOR WHEEZING OR SHORTNESS OF BREATH (Patient not taking: Reported on 08/27/2022) 8 g 2   Current Facility-Administered Medications  Medication Dose Route Frequency Provider Last Rate Last Admin   0.9 %  sodium chloride infusion   500 mL Intravenous Once Brett Morgan        Allergies as of 09/10/2022   (No Known Allergies)    Family History  Problem Relation Age of Onset   Arthritis Mother    Colon polyps Father    Heart disease Father 6       CAD   Colon polyps Maternal Grandfather    Hypertension Other    Cancer Other        grandparent, lung   Stroke Other        grandparent   Colon cancer Neg Hx    Esophageal cancer Neg Hx    Rectal cancer Neg Hx    Stomach cancer Neg Hx     Social History   Socioeconomic History   Marital status: Single    Spouse name: Not on file   Number of children: Not on file   Years of education: Not on file   Highest education level: Not on file  Occupational History   Not on file  Tobacco Use   Smoking status: Former    Packs/day: 1.00    Years: 32.00    Additional pack years: 0.00    Total pack years: 32.00    Types: Cigarettes, Cigars    Quit date: 2023    Years since quitting: 1.2   Smokeless tobacco: Former   Tobacco comments:    Occ cigar with friends stopped smoking cigarettes at the end 2023 uses a nicotine patch   Vaping Use   Vaping Use: Never used  Substance and Sexual Activity   Alcohol use: Yes    Comment: rarely   Drug use: No   Sexual activity: Not on file  Other Topics Concern   Not on file  Social History Narrative   Not on file   Social Determinants of Health   Financial Resource Strain: Not on file  Food Insecurity: Not on file  Transportation Needs: Not on file  Physical Activity: Not on file  Stress: Not on file  Social Connections: Not on file  Intimate Partner Violence: Not on file    Review of Systems:  All other review of systems negative except as mentioned in the HPI.  Physical Exam: Vital signs BP (!) 147/86   Pulse 66   Temp 98.6 F (37 C)   Resp 20   Ht 5\' 11"  (1.803 m)   Wt 239 lb 9.6 oz (108.7 kg)   SpO2 100%   BMI 33.42 kg/m   General:   Alert,  Well-developed, well-nourished, pleasant  and cooperative in NAD Lungs:  Clear throughout to auscultation.   Heart:  Regular rate and rhythm; no murmurs, clicks, rubs,  or gallops. Abdomen:  Soft, nontender and nondistended. Normal bowel sounds.  Neuro/Psych:  Alert and cooperative. Normal mood and affect. A and O x 3   @Jalexa Pifer  Sena Slate, Morgan, [facility] [phone] (pager) 09/10/2022 8:28 AM@

## 2022-09-10 NOTE — Progress Notes (Signed)
Report given to PACU, vss 

## 2022-09-10 NOTE — Progress Notes (Signed)
Pt's states no medical or surgical changes since previsit or office visit. 

## 2022-09-10 NOTE — Progress Notes (Signed)
Called to room to assist during endoscopic procedure.  Patient ID and intended procedure confirmed with present staff. Received instructions for my participation in the procedure from the performing physician.  

## 2022-09-10 NOTE — Op Note (Addendum)
[facility] Patient Name: Brett Morgan Procedure Date: 09/10/2022 8:21 AM MRN: [id number] Endoscopist: [name] , MD, [id number] Age: 52 Referring MD:  Date of Birth: 01/04/1971 Gender: Male Account #: [id number] Procedure:                Colonoscopy Indications:              Screening for colorectal malignant neoplasm, This                            is the patient's first colonoscopy Medicines:                Monitored Anesthesia Care Procedure:                Pre-Anesthesia Assessment:                           - Prior to the procedure, a History and Physical                            was performed, and patient medications and                            allergies were reviewed. The patient's tolerance of                            previous anesthesia was also reviewed. The risks                            and benefits of the procedure and the sedation                            options and risks were discussed with the patient.                            All questions were answered, and informed consent                            was obtained. Prior Anticoagulants: The patient has                            taken no anticoagulant or antiplatelet agents. ASA                            Grade Assessment: II - A patient with mild systemic                            disease. After reviewing the risks and benefits,                            the patient was deemed in satisfactory condition to                            undergo the procedure.  After obtaining informed consent, the colonoscope                            was passed under direct vision. Throughout the                            procedure, the patient's blood pressure, pulse, and                            oxygen saturations were monitored continuously. The                            Olympus CF-HQ190L [id number]) Colonoscope was                            introduced through the anus  and advanced to the the                            cecum, identified by appendiceal orifice and                            ileocecal valve. The colonoscopy was performed                            without difficulty. The patient tolerated the                            procedure well. The quality of the bowel                            preparation was good. The ileocecal valve,                            appendiceal orifice, and rectum were photographed.                            The bowel preparation used was Miralax via split                            dose instruction. Scope In: 8:36:26 AM Scope Out: 9:16:49 AM Scope Withdrawal Time: 0 hours 35 minutes 47 seconds  Total Procedure Duration: 0 hours 40 minutes 23 seconds  Findings:                 The perianal and digital rectal examinations were                            normal. Pertinent negatives include normal prostate                            (size, shape, and consistency).                           Twenty (20) sessile polyps were found in the  sigmoid colon, transverse colon, ascending colon                            and cecum. The polyps were 1 to 15 mm in size.                            These polyps were removed with a cold snare.                            Resection and retrieval were complete. Verification                            of patient identification for the specimen was                            done. Estimated blood loss was minimal.                           Multiple diverticula were found in the sigmoid                            colon.                           The exam was otherwise without abnormality on                            direct and retroflexion views. Complications:            No immediate complications. Estimated Blood Loss:     Estimated blood loss was minimal. Impression:               - Twenty (20) 1 to 15 mm polyps in the sigmoid                            colon,  in the transverse colon, in the ascending                            colon and in the cecum, removed with a cold snare.                            Resected and retrieved.                           - Diverticulosis in the sigmoid colon.                           - The examination was otherwise normal on direct                            and retroflexion views. Recommendation:           - Patient has a contact number available for  emergencies. The signs and symptoms of potential                            delayed complications were discussed with the                            patient. Return to normal activities tomorrow.                            Written discharge instructions were provided to the                            patient.                           - Resume previous diet.                           - Continue present medications.                           - Await pathology results.                           - Repeat colonoscopy is recommended. The                            colonoscopy date will be determined after pathology                            results from today's exam become available for                            review. [name], MD 09/10/2022 9:27:15 AM This report has been signed electronically.

## 2022-09-11 ENCOUNTER — Telehealth: Payer: Self-pay | Admitting: *Deleted

## 2022-09-11 NOTE — Telephone Encounter (Signed)
  Follow up Call-     09/10/2022    7:47 AM  Call back number  Post procedure Call Back phone  # [phone]  Permission to leave phone message Yes     Patient questions:  Do you have a fever, pain , or abdominal swelling? No. Pain Score  0 *  Have you tolerated food without any problems? Yes.    Have you been able to return to your normal activities? Yes.    Do you have any questions about your discharge instructions: Diet   No. Medications  No. Follow up visit  No.  Do you have questions or concerns about your Care? No.  Actions: * If pain score is 4 or above: No action needed, pain <4.

## 2022-09-15 ENCOUNTER — Encounter: Payer: Self-pay | Admitting: *Deleted

## 2022-09-18 ENCOUNTER — Telehealth: Payer: Self-pay | Admitting: Internal Medicine

## 2022-09-18 ENCOUNTER — Encounter: Payer: Self-pay | Admitting: Internal Medicine

## 2022-09-18 DIAGNOSIS — D1391 Familial adenomatous polyposis: Secondary | ICD-10-CM

## 2022-09-18 NOTE — Telephone Encounter (Signed)
Patient had 20 adenomas on colonoscopy # 1 Needs referral to genetics at cancer center I called him about it and he is ok with this  Reason - polyposis coli Please do genetic screening

## 2022-09-18 NOTE — Telephone Encounter (Signed)
The referral has been made to genetics as ordered

## 2022-10-05 ENCOUNTER — Encounter: Payer: Self-pay | Admitting: Family Medicine

## 2022-10-07 ENCOUNTER — Other Ambulatory Visit (INDEPENDENT_AMBULATORY_CARE_PROVIDER_SITE_OTHER): Payer: 59

## 2022-10-07 DIAGNOSIS — E785 Hyperlipidemia, unspecified: Secondary | ICD-10-CM

## 2022-10-07 LAB — HEPATIC FUNCTION PANEL
ALT: 25 U/L (ref 0–53)
AST: 21 U/L (ref 0–37)
Albumin: 4.4 g/dL (ref 3.5–5.2)
Alkaline Phosphatase: 77 U/L (ref 39–117)
Bilirubin, Direct: 0.1 mg/dL (ref 0.0–0.3)
Total Bilirubin: 0.4 mg/dL (ref 0.2–1.2)
Total Protein: 7.4 g/dL (ref 6.0–8.3)

## 2022-10-07 LAB — LIPID PANEL
Cholesterol: 134 mg/dL (ref 0–200)
HDL: 33.9 mg/dL — ABNORMAL LOW (ref >39.00)
LDL Cholesterol: 73 mg/dL (ref 0–99)
NonHDL: 100.25
Total CHOL/HDL Ratio: 4
Triglycerides: 137 mg/dL (ref 0.0–149.0)
VLDL: 27.4 mg/dL (ref 0.0–40.0)

## 2022-10-20 ENCOUNTER — Encounter: Payer: Self-pay | Admitting: Family Medicine

## 2022-10-20 NOTE — Telephone Encounter (Signed)
Forms placed in Dr. Claris Che folder for signature

## 2022-10-30 ENCOUNTER — Encounter: Payer: Self-pay | Admitting: Family Medicine

## 2022-10-30 MED ORDER — ROSUVASTATIN CALCIUM 20 MG PO TABS: ORAL_TABLET | ORAL | 0 refills | Status: DC

## 2022-11-12 ENCOUNTER — Other Ambulatory Visit: Payer: Self-pay | Admitting: Family Medicine

## 2022-12-31 ENCOUNTER — Encounter: Payer: Self-pay | Admitting: Family Medicine

## 2023-02-03 ENCOUNTER — Other Ambulatory Visit: Payer: Self-pay | Admitting: Family Medicine

## 2023-03-18 IMAGING — CT CT CARDIAC CORONARY ARTERY CALCIUM SCORE
3 series · 14 of 20 positions shown, 15 images · non-contrast
Comparison: None.
COMPARISON: None.

Addendum:
EXAM:
OVER-READ INTERPRETATION  CT CHEST

The following report is an over-read performed by radiologist Dr.
Rudi Jumper [REDACTED] on 11/16/2020. This
over-read does not include interpretation of cardiac or coronary
anatomy or pathology. The coronary calcium score interpretation by
the cardiologist is attached.
CLINICAL DATA: Risk stratification: 49 Year-old Caucasian male
Coronary Calcium Score
TECHNIQUE: The patient was scanned on a Siemens Force scanner. Axial
non-contrast 3 mm slices were carried out through the heart. The
data set was analyzed on a dedicated work station and scored using
the Agatson method.

[Series 2: casc 3.0 bv41 2 bestdiast 69 % · axial · 0.42mm/px · z∈[-224,-142]mm · 4 of 46 slices shown, 5 images]
[im 10/46  vessel]
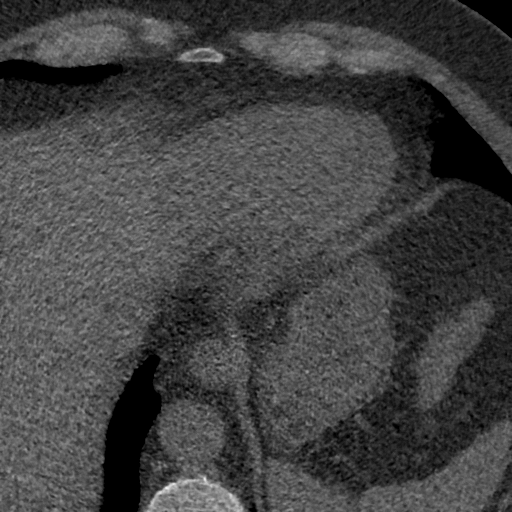
[im 10/46  lung]
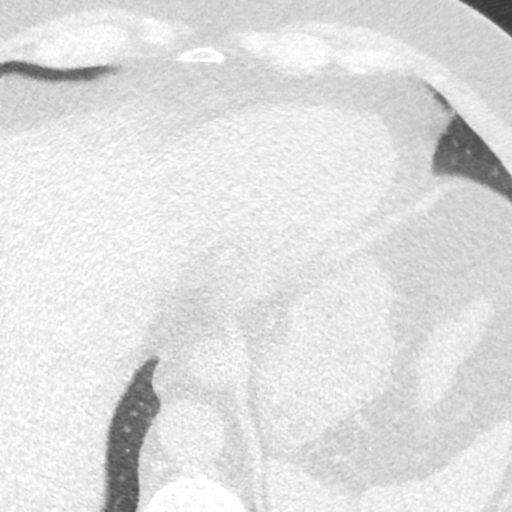
[im 19/46  vessel]
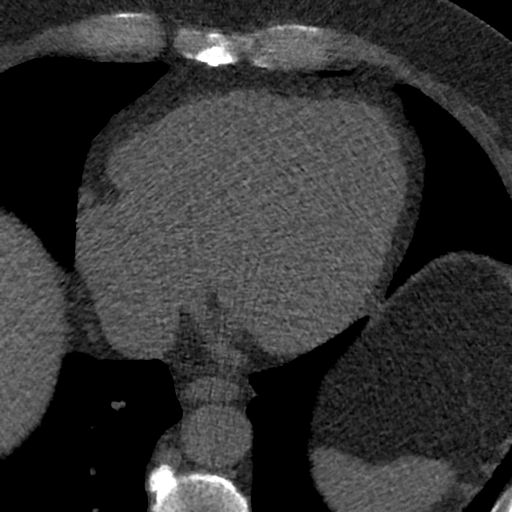
[im 28/46  vessel]
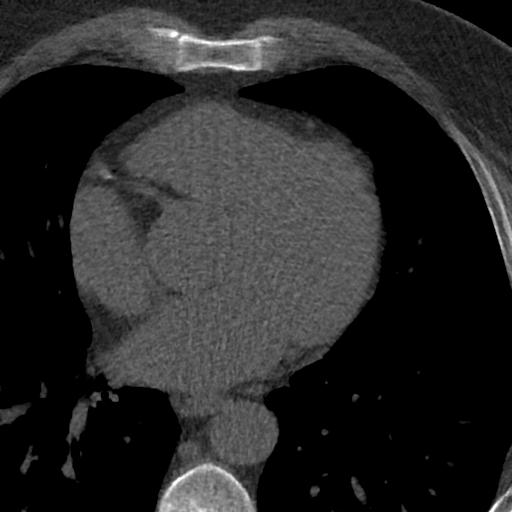
[im 37/46  vessel]
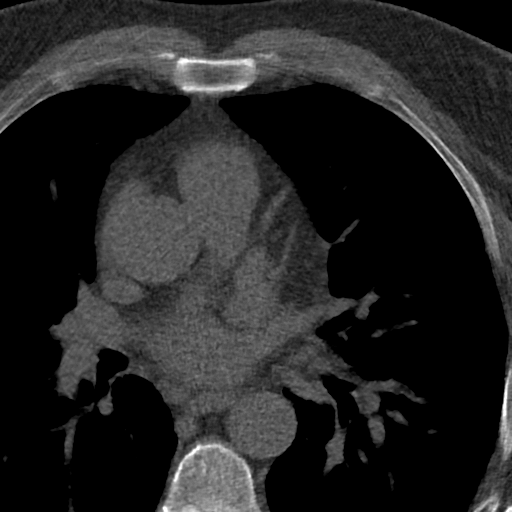

[Series 3: lung 69 % · axial · 0.71mm/px · z∈[-230,-140]mm · 5 of 46 slices shown]
[im 8/46  lung]
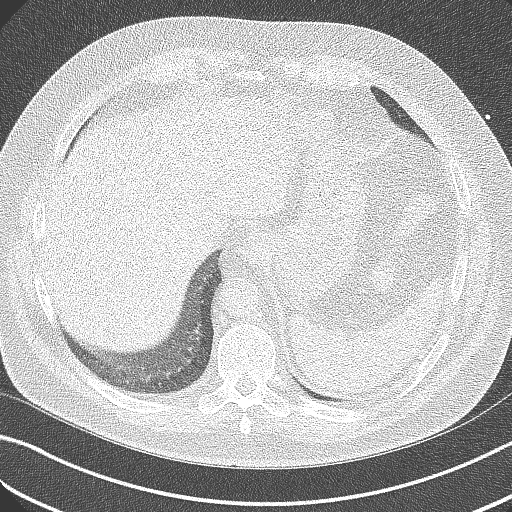
[im 16/46  lung]
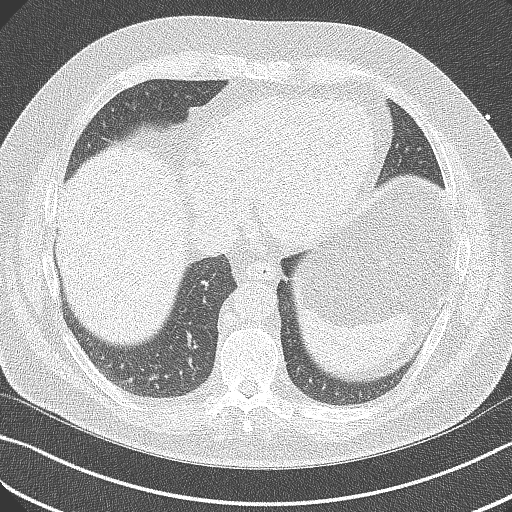
[im 23/46  lung]
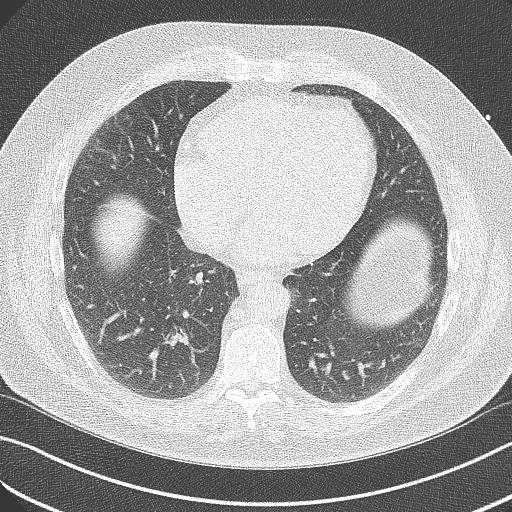
[im 31/46  lung]
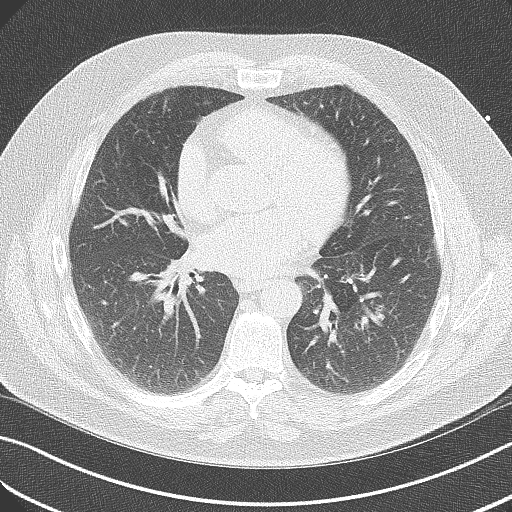
[im 38/46  lung]
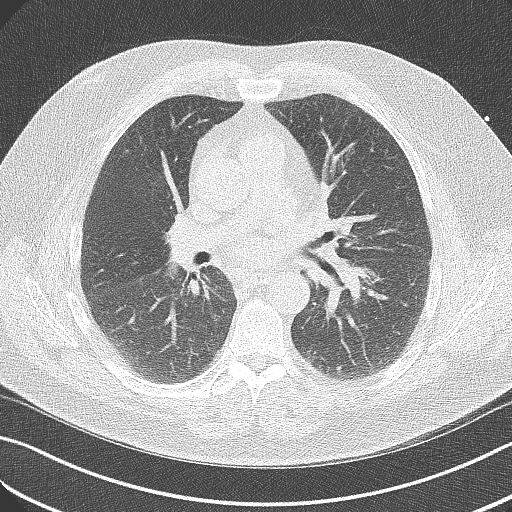

[Series 4: lung st 69 % · axial · 0.71mm/px · z∈[-230,-140]mm · 5 of 46 slices shown]
[im 8/46  lung]
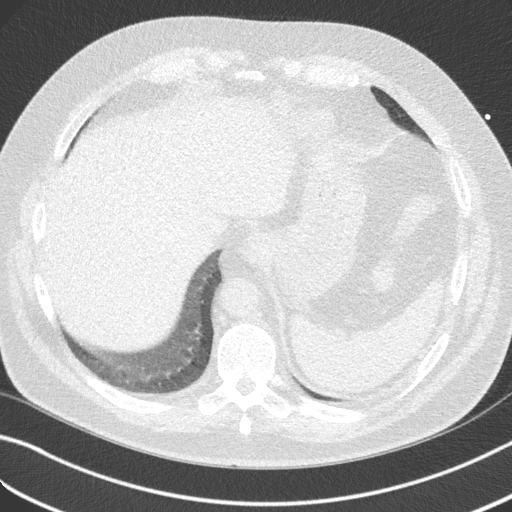
[im 16/46  lung]
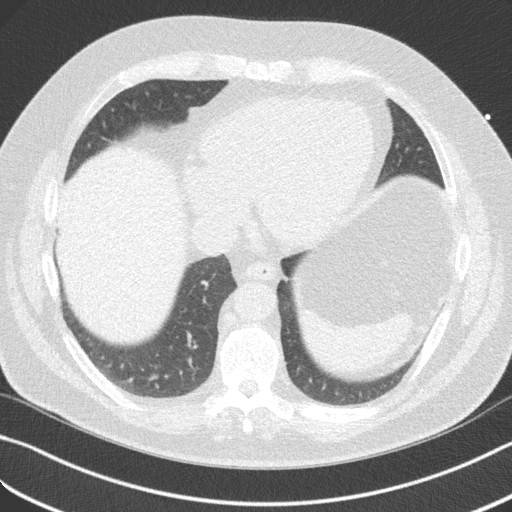
[im 23/46  lung]
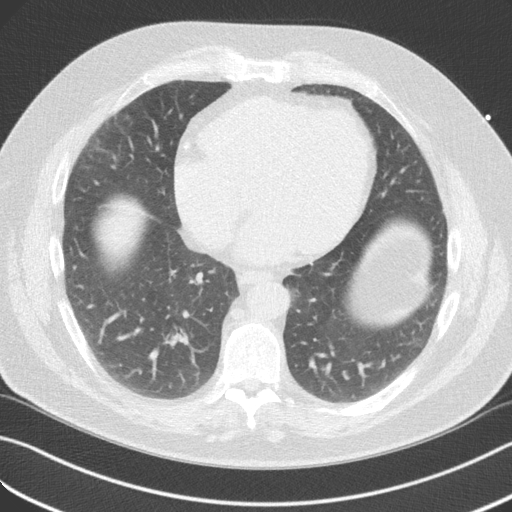
[im 31/46  lung]
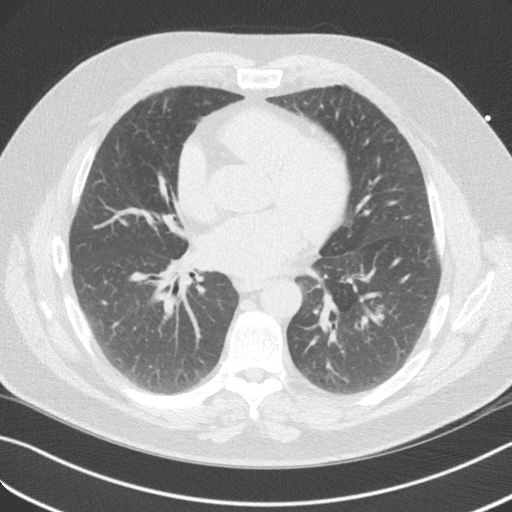
[im 38/46  lung]
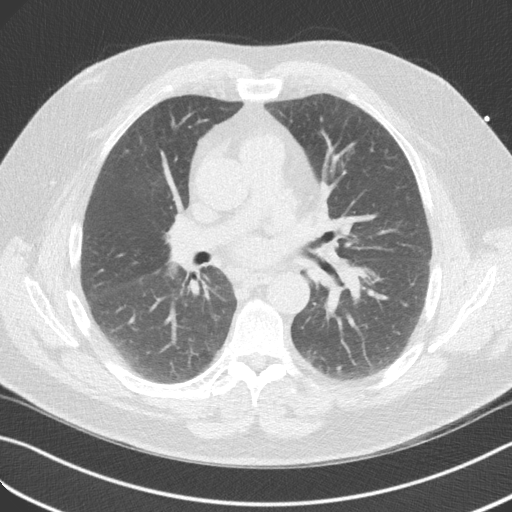

[14 of 20 positions shown; findings below may reference images not displayed]

FINDINGS: Atherosclerotic calcifications in the thoracic aorta. Within the
visualized portions of the thorax there are no suspicious appearing
pulmonary nodules or masses, there is no acute consolidative
airspace disease, no pleural effusions, no pneumothorax and no
lymphadenopathy. Visualized portions of the upper abdomen are
unremarkable. There are no aggressive appearing lytic or blastic
lesions noted in the visualized portions of the skeleton.
IMPRESSION: 1.  Aortic Atherosclerosis (5A5NY-0FM.M).
FINDINGS: Non-cardiac: See separate report from [REDACTED].

Ascending Aorta: 41 mm maximum diameter on non-contrasted study.

Descending aorta atherosclerosis noted.

Pericardium: Normal.

Coronary arteries: Normal origins.

Coronary Calcium Score:

Left main: 0

Left anterior descending artery: 5

Left circumflex artery: 0

Right coronary artery: 39

Total: 44

Percentile: 85th for age, sex, and race matched control.
IMPRESSION: 1. Coronary calcium score of 44. This was 85th percentile for age,
gender, and race matched controls.

2. Evidence of mild ascending aortic dilation on non-contrasted
study. Consider secondary imaging modality (echocardiogram, CTA
Aorta Protocol, MRA Aorta Protocol) if clinically indicated.

3.  Aortic atherosclerosis noted.

RECOMMENDATIONS:



If CAC = 0, it is reasonable to withhold statin therapy and reassess
in 5 to 10 years, as long as higher risk conditions are absent
(diabetes mellitus, family history of premature CHD in first degree
relatives (males <55 years; females <65 years), cigarette smoking,
LDL >=190 mg/dL or other independent risk factors).

If CAC is 1 to 99, it is reasonable to initiate statin therapy for
patients ?55 years of age.

If CAC is >=100 or >=75th percentile, it is reasonable to initiate
statin therapy at any age.

Cardiology referral should be considered for patients with CAC
scores =400 or >=75th percentile.

*7612 AHA/ACC/AACVPR/AAPA/ABC/MYD/SOROGE/GURIERREZ/Zanbrano/BENJAMIN/YVAN/ANNAMMA
Guideline on the Management of Blood Cholesterol: A Report of the
American College of Cardiology/American Heart Association Task Force
on Clinical Practice Guidelines. J Am Coll Cardiol.
1814;73(24):6171-6392.

*** End of Addendum ***
EXAM:
OVER-READ INTERPRETATION  CT CHEST

The following report is an over-read performed by radiologist Dr.
Rudi Jumper [REDACTED] on 11/16/2020. This
over-read does not include interpretation of cardiac or coronary
anatomy or pathology. The coronary calcium score interpretation by
the cardiologist is attached.
FINDINGS: Atherosclerotic calcifications in the thoracic aorta. Within the
visualized portions of the thorax there are no suspicious appearing
pulmonary nodules or masses, there is no acute consolidative
airspace disease, no pleural effusions, no pneumothorax and no
lymphadenopathy. Visualized portions of the upper abdomen are
unremarkable. There are no aggressive appearing lytic or blastic
lesions noted in the visualized portions of the skeleton.
IMPRESSION: 1.  Aortic Atherosclerosis (5A5NY-0FM.M).

## 2023-05-12 ENCOUNTER — Other Ambulatory Visit: Payer: Self-pay | Admitting: Family Medicine

## 2023-05-21 ENCOUNTER — Encounter: Payer: Self-pay | Admitting: Podiatry

## 2023-05-21 ENCOUNTER — Ambulatory Visit: Payer: 59 | Admitting: Podiatry

## 2023-05-21 DIAGNOSIS — L600 Ingrowing nail: Secondary | ICD-10-CM | POA: Diagnosis not present

## 2023-05-21 MED ORDER — NEOMYCIN-POLYMYXIN-HC 1 % OT SOLN: OTIC | 1 refills | Status: DC

## 2023-05-21 NOTE — Progress Notes (Signed)
Brett Morgan presents today having seen him for a few years chief complaint of an ingrown toenail to the fibular border of the hallux right.  States been like this for about 10 days now and is becoming more painful.  Objective: Vital signs are stable alert oriented x 3.  There is no erythema edema cellulitis drainage or odor to the rest of the foot however the fibular border of the nail does demonstrate sharp incurvated nail margin with erythema and almost a purple tent to the tip of the toe.  Assessment: Ingrown toenail fibular border hallux right.  Plan: Performed a chemical matricectomy to the fibular border tolerated procedure well after local anesthetic was administered he was given both oral and home-going instructions for the care and soaking of the toes as well as a prescription for Cortisporin Otic to be applied twice daily after soaking.  Follow-up with him in 2 to 3 weeks.

## 2023-05-21 NOTE — Patient Instructions (Signed)

## 2023-06-04 ENCOUNTER — Ambulatory Visit: Payer: 59 | Admitting: Podiatry

## 2023-08-03 ENCOUNTER — Encounter: Payer: 59 | Admitting: Family Medicine

## 2023-08-06 ENCOUNTER — Encounter: Payer: Self-pay | Admitting: Internal Medicine

## 2023-08-24 ENCOUNTER — Ambulatory Visit (AMBULATORY_SURGERY_CENTER)

## 2023-08-24 ENCOUNTER — Other Ambulatory Visit: Payer: Self-pay | Admitting: Family Medicine

## 2023-08-24 VITALS — Ht 71.0 in | Wt 247.0 lb

## 2023-08-24 DIAGNOSIS — Z860100 Personal history of colon polyps, unspecified: Secondary | ICD-10-CM

## 2023-08-24 NOTE — Patient Instructions (Signed)
 [facility] has implemented a new process for scheduling procedures.  Please note your arrival time for the [facility] is your appointment time that is shown on your written instructions.  Please do not arrive one hour prior to the time listed in your instructions.  Please ignore any outside notifications to arrive one hour early.  We apologize for any confusion and look forward to seeing you for your procedure.

## 2023-08-24 NOTE — Progress Notes (Signed)
 No egg or soy allergy known to patient  No issues known to pt with past sedation with any surgeries or procedures Patient denies ever being told they had issues or difficulty with intubation  No FH of Malignant Hyperthermia Pt is not on diet pills Pt is not on  home 02  Pt is not on blood thinners  Pt denies issues with constipation  No A fib or A flutter Have any cardiac testing pending--no  LOA: independent Prep: spilt dose miralax   Patient's chart reviewed by Cathlyn Parsons CNRA prior to previsit and patient appropriate for the [facility].  Previsit completed and red dot placed by patient's name on their procedure day (on provider's schedule).     PV completed with patient. Prep instructions sent via mychart and home address.

## 2023-08-26 ENCOUNTER — Other Ambulatory Visit: Payer: Self-pay | Admitting: Acute Care

## 2023-08-26 DIAGNOSIS — Z122 Encounter for screening for malignant neoplasm of respiratory organs: Secondary | ICD-10-CM

## 2023-08-26 DIAGNOSIS — Z87891 Personal history of nicotine dependence: Secondary | ICD-10-CM

## 2023-09-04 ENCOUNTER — Encounter: Payer: 59 | Admitting: Family Medicine

## 2023-09-08 ENCOUNTER — Ambulatory Visit
Admission: RE | Admit: 2023-09-08 | Discharge: 2023-09-08 | Disposition: A | Discharge status: Home / Self Care | Source: Ambulatory Visit | Attending: Acute Care | Admitting: Acute Care

## 2023-09-08 DIAGNOSIS — Z87891 Personal history of nicotine dependence: Secondary | ICD-10-CM

## 2023-09-08 DIAGNOSIS — Z122 Encounter for screening for malignant neoplasm of respiratory organs: Secondary | ICD-10-CM

## 2023-09-17 ENCOUNTER — Encounter: Payer: Self-pay | Admitting: Internal Medicine

## 2023-09-21 NOTE — Progress Notes (Unsigned)
 [facility] History and Physical   Primary Care Physician:  Marquetta Sit, MD   Reason for Procedure:   Hx polyps/polyposis coli  Plan:    colonoscopy     HPI: Brett Morgan is a 53 y.o. male s/p removal of 20 adenomas max 15 mm on initial screening colonoscopy 1 year ago. For f/u exam.   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   20 adenomas max 15 mm - recall 1 year - recommending genetics eval    Past Surgical History:  Procedure Laterality Date   APPENDECTOMY     COLONOSCOPY W/ POLYPECTOMY  09/10/2022   KNEE SURGERY     arthroscopic left   LAPAROSCOPIC APPENDECTOMY N/A 02/16/2013   Procedure: APPENDECTOMY LAPAROSCOPIC;  Surgeon: Joyce Nixon, MD;  Location: [facility];  Service: General;  Laterality: N/A;   LUMBAR LAMINECTOMY/DECOMPRESSION MICRODISCECTOMY  10/16/2011   Procedure: LUMBAR LAMINECTOMY/DECOMPRESSION MICRODISCECTOMY;  Surgeon: Loel Ring, MD;  Location: [facility];  Service: Orthopedics;  Laterality: Right;  Micro Lumbar Decompression L4-L5 on Right   TONSILLECTOMY     TONSILLECTOMY      Prior to Admission medications   Medication Sig Start Date End Date Taking? Authorizing Provider  acetaminophen  (TYLENOL ) 325 MG tablet Take 650 mg by mouth every 6 (six) hours as needed for headache.    [provider]  clobetasol cream (TEMOVATE) 0.05 % Apply 1 Application topically 2 (two) times daily as needed. 08/05/22   [provider]  methocarbamol  (ROBAXIN ) 500 MG tablet Take 500 mg by mouth every 8 (eight) hours as needed. 11/16/19   [provider]  MOBIC  15 MG tablet Take 15 mg by mouth daily as needed for pain. 07/02/23   [provider]  Multiple Vitamin (MULITIVITAMIN WITH MINERALS) TABS Take 1 tablet by mouth daily.    [provider]  Omega-3 Fatty Acids (FISH OIL) 1000 MG CAPS Take 1,000 mg by mouth daily. 07/03/22   [provider]  oxyCODONE -acetaminophen  (PERCOCET) 5-325 MG tablet Take 1 tablet by mouth 2 (two) times daily as needed. 07/13/17   [provider]  rosuvastatin  (CRESTOR ) 20 MG tablet TAKE 1 TABLET BY MOUTH ONCE DAILY 08/24/23   [name], Marijean Shouts, MD  VENTOLIN  HFA 108 (90 Base) MCG/ACT inhaler INHALE 1 TO 2 PUFFS BY MOUTH EVERY 4 HOURS AS NEEDED FOR WHEEZING OR SHORTNESS OF BREATH Patient not taking: Reported on 08/24/2023 08/01/22   Marquetta Sit, MD    Current Outpatient Medications  Medication Sig Dispense Refill   acetaminophen  (TYLENOL ) 325 MG tablet Take 650 mg by mouth every 6 (six) hours as needed for headache.     methocarbamol  (ROBAXIN ) 500 MG tablet Take 500 mg by mouth every 8 (eight) hours as needed.     MOBIC  15 MG tablet Take 15 mg by mouth daily as needed for pain.     Multiple Vitamin (MULITIVITAMIN WITH MINERALS) TABS Take 1 tablet by mouth daily.     Omega-3 Fatty Acids (FISH OIL) 1000  MG CAPS Take 1,000 mg by mouth daily.     rosuvastatin  (CRESTOR ) 20 MG tablet TAKE 1 TABLET BY MOUTH ONCE DAILY 90 tablet 0   VENTOLIN  HFA 108 (90 Base) MCG/ACT inhaler INHALE 1 TO 2 PUFFS BY MOUTH EVERY 4 HOURS AS NEEDED FOR WHEEZING OR SHORTNESS OF BREATH 8 g 2   clobetasol cream (TEMOVATE) 0.05 % Apply 1 Application topically 2 (two) times daily as needed.     oxyCODONE -acetaminophen  (PERCOCET) 5-325 MG tablet Take 1 tablet by mouth 2 (two) times daily as needed.     Current Facility-Administered Medications  Medication Dose Route Frequency Provider Last Rate Last Admin   0.9 %  sodium chloride  infusion  500 mL Intravenous Once Kenney Peacemaker, MD         Allergies as of 09/22/2023   (No Known Allergies)    Family History  Problem Relation Age of Onset   Arthritis Mother    Colon polyps Father    Heart disease Father 41       CAD   Colon polyps Maternal Grandfather    Hypertension Other    Cancer Other        grandparent, lung   Stroke Other        grandparent   Colon cancer Neg Hx    Esophageal cancer Neg Hx    Rectal cancer Neg Hx    Stomach cancer Neg Hx     Social History   Socioeconomic History   Marital status: Single    Spouse name: Not on file   Number of children: Not on file   Years of education: Not on file   Highest education level: Not on file  Occupational History   Not on file  Tobacco Use   Smoking status: Former    Current packs/day: 0.00    Average packs/day: 1 pack/day for 32.0 years (32.0 ttl pk-yrs)    Types: Cigarettes, Cigars    Start date: 61    Quit date: 2023    Years since quitting: 2.3   Smokeless tobacco: Former   Tobacco comments:    Occ cigar with friends stopped smoking cigarettes at the end 2023 uses a nicotine  patch   Vaping Use   Vaping status: Never Used  Substance and Sexual Activity   Alcohol use: Yes    Comment: rarely   Drug use: No   Sexual activity: Not on file  Other Topics Concern   Not on file  Social History Narrative   Not on file   Social Drivers of Health   Financial Resource Strain: Not on file  Food Insecurity: Not on file  Transportation Needs: Not on file  Physical Activity: Not on file  Stress: Not on file  Social Connections: Not on file  Intimate Partner Violence: Not on file    Review of Systems:  All other review of systems negative except as mentioned in the HPI.  Physical Exam: Vital signs BP (!) 140/85   Pulse 82   Temp 99.1 F (37.3 C) (Temporal)   Ht 5\' 11"  (1.803 m)   Wt 247 lb (112 kg)   SpO2 97%   BMI 34.45 kg/m   General:   Alert,  Well-developed, well-nourished, pleasant and cooperative in NAD Lungs:  Clear  throughout to auscultation.   Heart:  Regular rate and rhythm; no murmurs, clicks, rubs,  or gallops. Abdomen:  Soft, nontender and nondistended. Normal bowel sounds.   Neuro/Psych:  Alert and cooperative. Normal mood and  affect. A and O x 3   @Melynda Krzywicki  Tammie Fall, MD, [facility] [phone] (pager) 09/22/2023 7:57 AM@

## 2023-09-22 ENCOUNTER — Ambulatory Visit (AMBULATORY_SURGERY_CENTER): Admitting: Internal Medicine

## 2023-09-22 ENCOUNTER — Encounter: Payer: Self-pay | Admitting: Internal Medicine

## 2023-09-22 VITALS — BP 161/96 | HR 74 | Temp 99.1°F | Resp 15 | Ht 71.0 in | Wt 247.0 lb

## 2023-09-22 DIAGNOSIS — D1391 Familial adenomatous polyposis: Secondary | ICD-10-CM

## 2023-09-22 DIAGNOSIS — Z1211 Encounter for screening for malignant neoplasm of colon: Secondary | ICD-10-CM | POA: Diagnosis present

## 2023-09-22 DIAGNOSIS — D124 Benign neoplasm of descending colon: Secondary | ICD-10-CM

## 2023-09-22 DIAGNOSIS — K5730 Diverticulosis of large intestine without perforation or abscess without bleeding: Secondary | ICD-10-CM

## 2023-09-22 DIAGNOSIS — D122 Benign neoplasm of ascending colon: Secondary | ICD-10-CM | POA: Diagnosis not present

## 2023-09-22 DIAGNOSIS — Z860101 Personal history of adenomatous and serrated colon polyps: Secondary | ICD-10-CM | POA: Diagnosis not present

## 2023-09-22 DIAGNOSIS — Z860100 Personal history of colon polyps, unspecified: Secondary | ICD-10-CM

## 2023-09-22 MED ORDER — SODIUM CHLORIDE 0.9 % IV SOLN: 500.0000 mL | Freq: Once | INTRAVENOUS | Status: DC

## 2023-09-22 NOTE — Patient Instructions (Addendum)
 Only two polyps today.  I will have them analyzed and let you know when to repeat and we will refer to genetics again.  I appreciate the opportunity to care for you. Kenney Peacemaker, MD, FACG  YOU HAD AN ENDOSCOPIC PROCEDURE TODAY AT THE [facility]:   Refer to the procedure report that was given to you for any specific questions about what was found during the examination.  If the procedure report does not answer your questions, please call your gastroenterologist to clarify.  If you requested that your care partner not be given the details of your procedure findings, then the procedure report has been included in a sealed envelope for you to review at your convenience later.  YOU SHOULD EXPECT: Some feelings of bloating in the abdomen. Passage of more gas than usual.  Walking can help get rid of the air that was put into your GI tract during the procedure and reduce the bloating. If you had a lower endoscopy (such as a colonoscopy or flexible sigmoidoscopy) you may notice spotting of blood in your stool or on the toilet paper. If you underwent a bowel prep for your procedure, you may not have a normal bowel movement for a few days.  Please Note:  You might notice some irritation and congestion in your nose or some drainage.  This is from the oxygen used during your procedure.  There is no need for concern and it should clear up in a day or so.  SYMPTOMS TO REPORT IMMEDIATELY:  Following lower endoscopy (colonoscopy or flexible sigmoidoscopy):  Excessive amounts of blood in the stool  Significant tenderness or worsening of abdominal pains  Swelling of the abdomen that is new, acute  Fever of 100F or higher  For urgent or emergent issues, a gastroenterologist can be reached at any hour by calling [phone]. Do not use MyChart messaging for urgent concerns.    DIET:  We do recommend a small meal at first, but then you may proceed to your regular diet.  Drink plenty of  fluids but you should avoid alcoholic beverages for 24 hours.  ACTIVITY:  You should plan to take it easy for the rest of today and you should NOT DRIVE or use heavy machinery until tomorrow (because of the sedation medicines used during the test).    FOLLOW UP: Our staff will call the number listed on your records the next business day following your procedure.  We will call around 7:15- 8:00 am to check on you and address any questions or concerns that you may have regarding the information given to you following your procedure. If we do not reach you, we will leave a message.     If any biopsies were taken you will be contacted by phone or by letter within the next 1-3 weeks.  Please call us  at [phone] if you have not heard about the biopsies in 3 weeks.    SIGNATURES/CONFIDENTIALITY: You and/or your care partner have signed paperwork which will be entered into your electronic medical record.  These signatures attest to the fact that that the information above on your After Visit Summary has been reviewed and is understood.  Full responsibility of the confidentiality of this discharge information lies with you and/or your care-partner.

## 2023-09-22 NOTE — Progress Notes (Signed)
 Called to room to assist during endoscopic procedure.  Patient ID and intended procedure confirmed with present staff. Received instructions for my participation in the procedure from the performing physician.

## 2023-09-22 NOTE — Progress Notes (Signed)
 Pt's states no medical or surgical changes since previsit or office visit.

## 2023-09-22 NOTE — Op Note (Signed)
 [facility] Patient Name: Brett Morgan Procedure Date: 09/22/2023 8:00 AM MRN: [id number] Endoscopist: Kenney Peacemaker , MD, [id number] Age: 53 Referring MD:  Date of Birth: 01/31/1971 Gender: Male Account #: [id number] Procedure:                Colonoscopy Indications:              Surveillance: History of numerous (> 10) adenomas                            on last colonoscopy (< 3 yrs), Last colonoscopy:                            April 2024 Medicines:                Monitored Anesthesia Care Procedure:                Pre-Anesthesia Assessment:                           - Prior to the procedure, a History and Physical                            was performed, and patient medications and                            allergies were reviewed. The patient's tolerance of                            previous anesthesia was also reviewed. The risks                            and benefits of the procedure and the sedation                            options and risks were discussed with the patient.                            All questions were answered, and informed consent                            was obtained. Prior Anticoagulants: The patient has                            taken no anticoagulant or antiplatelet agents. ASA                            Grade Assessment: II - A patient with mild systemic                            disease. After reviewing the risks and benefits,                            the patient was deemed in satisfactory condition to  undergo the procedure.                           After obtaining informed consent, the colonoscope                            was passed under direct vision. Throughout the                            procedure, the patient's blood pressure, pulse, and                            oxygen saturations were monitored continuously. The                            Olympus CF-HQ190L ([id number]) Colonoscope was                             introduced through the anus and advanced to the the                            cecum, identified by appendiceal orifice and                            ileocecal valve. The colonoscopy was performed                            without difficulty. The patient tolerated the                            procedure well. The quality of the bowel                            preparation was good. The ileocecal valve,                            appendiceal orifice, and rectum were photographed.                            The bowel preparation used was Miralax via split                            dose instruction. Scope In: 8:06:09 AM Scope Out: 8:25:38 AM Scope Withdrawal Time: 0 hours 15 minutes 49 seconds  Total Procedure Duration: 0 hours 19 minutes 29 seconds  Findings:                 The perianal and digital rectal examinations were                            normal. Pertinent negatives include normal prostate                            (size, shape, and consistency).  Two sessile polyps were found in the descending                            colon and ascending colon. The polyps were 6 to 8                            mm in size. These polyps were removed with a cold                            snare. Resection and retrieval were complete.                            Verification of patient identification for the                            specimen was done. Estimated blood loss was minimal.                           Multiple diverticula were found in the sigmoid                            colon and descending colon. There was no evidence                            of diverticular bleeding.                           The exam was otherwise without abnormality on                            direct and retroflexion views. Complications:            No immediate complications. Estimated Blood Loss:     Estimated blood loss was minimal. Impression:                - Two 6 to 8 mm polyps in the descending colon and                            in the ascending colon, removed with a cold snare.                            Resected and retrieved. I think ascending colon is                            residual from the 15 mm polyp snared last year.                           - Moderate diverticulosis in the sigmoid colon and                            in the descending colon. There was no evidence of  diverticular bleeding.                           - The examination was otherwise normal on direct                            and retroflexion views.                           - Personal history of colonic polyps. Polyposis                            coli - attenuated - 20 polyps all adenomas max 15                            mm 4/24 Recommendation:           - Patient has a contact number available for                            emergencies. The signs and symptoms of potential                            delayed complications were discussed with the                            patient. Return to normal activities tomorrow.                            Written discharge instructions were provided to the                            patient.                           - Resume previous diet.                           - Continue present medications.                           - Await pathology results.                           - Repeat colonoscopy is recommended for                            surveillance. The colonoscopy date will be                            determined after pathology results from today's                            exam become available for review.                           - Office to refer to genetics - evaluate  patient                            with polyposis coli Kenney Peacemaker, MD 09/22/2023 8:39:38 AM This report has been signed electronically.

## 2023-09-22 NOTE — Progress Notes (Signed)
 Pt A/O x 3, gd SR's, pleased with anesthesia, report to RN

## 2023-09-23 ENCOUNTER — Telehealth: Payer: Self-pay

## 2023-09-23 DIAGNOSIS — D1391 Familial adenomatous polyposis: Secondary | ICD-10-CM

## 2023-09-23 NOTE — Telephone Encounter (Signed)
  Follow up Call-     09/22/2023    7:41 AM 09/10/2022    7:47 AM  Call back number  Post procedure Call Back phone  # [phone]  Permission to leave phone message Yes Yes     Patient questions:  Do you have a fever, pain , or abdominal swelling? No. Pain Score  0 *  Have you tolerated food without any problems? Yes.    Have you been able to return to your normal activities? Yes.    Do you have any questions about your discharge instructions: Diet   No. Medications  No. Follow up visit  No.  Do you have questions or concerns about your Care? No.  Actions: * If pain score is 4 or above: No action needed, pain <4.

## 2023-09-23 NOTE — Telephone Encounter (Signed)
 Order placed for genetics appointment to evaluate patient with polyposis coli. Brett Morgan is aware and will look for them to call him.

## 2023-09-24 LAB — SURGICAL PATHOLOGY

## 2023-09-25 ENCOUNTER — Encounter: Payer: Self-pay | Admitting: Internal Medicine

## 2023-09-30 NOTE — Telephone Encounter (Signed)
 Genetics appointment set up for 11/17/2023 at 11:00am.

## 2023-10-05 ENCOUNTER — Ambulatory Visit (INDEPENDENT_AMBULATORY_CARE_PROVIDER_SITE_OTHER): Admitting: Family Medicine

## 2023-10-05 ENCOUNTER — Encounter: Payer: Self-pay | Admitting: Family Medicine

## 2023-10-05 VITALS — BP 158/84 | HR 85 | Temp 97.8°F | Ht 70.47 in | Wt 253.8 lb

## 2023-10-05 DIAGNOSIS — Z0000 Encounter for general adult medical examination without abnormal findings: Secondary | ICD-10-CM

## 2023-10-05 LAB — CBC WITH DIFFERENTIAL/PLATELET
Basophils Absolute: 0 10*3/uL (ref 0.0–0.1)
Basophils Relative: 0.5 % (ref 0.0–3.0)
Eosinophils Absolute: 0.2 10*3/uL (ref 0.0–0.7)
Eosinophils Relative: 2.3 % (ref 0.0–5.0)
HCT: 41.7 % (ref 39.0–52.0)
Hemoglobin: 13.9 g/dL (ref 13.0–17.0)
Lymphocytes Relative: 37.6 % (ref 12.0–46.0)
Lymphs Abs: 2.8 10*3/uL (ref 0.7–4.0)
MCHC: 33.3 g/dL (ref 30.0–36.0)
MCV: 93.3 fl (ref 78.0–100.0)
Monocytes Absolute: 0.6 10*3/uL (ref 0.1–1.0)
Monocytes Relative: 7.3 % (ref 3.0–12.0)
Neutro Abs: 3.9 10*3/uL (ref 1.4–7.7)
Neutrophils Relative %: 52.3 % (ref 43.0–77.0)
Platelets: 210 10*3/uL (ref 150.0–400.0)
RBC: 4.47 Mil/uL (ref 4.22–5.81)
RDW: 13.8 % (ref 11.5–15.5)
WBC: 7.6 10*3/uL (ref 4.0–10.5)

## 2023-10-05 LAB — LIPID PANEL
Cholesterol: 152 mg/dL (ref 0–200)
HDL: 31.5 mg/dL — ABNORMAL LOW (ref >39.00)
LDL Cholesterol: 100 mg/dL — ABNORMAL HIGH (ref 0–99)
NonHDL: 120.25
Total CHOL/HDL Ratio: 5
Triglycerides: 102 mg/dL (ref 0.0–149.0)
VLDL: 20.4 mg/dL (ref 0.0–40.0)

## 2023-10-05 LAB — HEPATIC FUNCTION PANEL
ALT: 18 U/L (ref 0–53)
AST: 19 U/L (ref 0–37)
Albumin: 4.5 g/dL (ref 3.5–5.2)
Alkaline Phosphatase: 85 U/L (ref 39–117)
Bilirubin, Direct: 0.1 mg/dL (ref 0.0–0.3)
Total Bilirubin: 0.5 mg/dL (ref 0.2–1.2)
Total Protein: 7.2 g/dL (ref 6.0–8.3)

## 2023-10-05 LAB — BASIC METABOLIC PANEL WITH GFR
BUN: 16 mg/dL (ref 6–23)
CO2: 28 meq/L (ref 19–32)
Calcium: 9.7 mg/dL (ref 8.4–10.5)
Chloride: 103 meq/L (ref 96–112)
Creatinine, Ser: 0.88 mg/dL (ref 0.40–1.50)
GFR: 99.42 mL/min (ref >60.00)
Glucose, Bld: 100 mg/dL — ABNORMAL HIGH (ref 70–99)
Potassium: 4.8 meq/L (ref 3.5–5.1)
Sodium: 140 meq/L (ref 135–145)

## 2023-10-05 LAB — PSA: PSA: 1.29 ng/mL (ref 0.10–4.00)

## 2023-10-05 MED ORDER — TELMISARTAN 40 MG PO TABS: 40.0000 mg | ORAL_TABLET | Freq: Every day | ORAL | 3 refills | Status: DC

## 2023-10-05 MED ORDER — ROSUVASTATIN CALCIUM 20 MG PO TABS: 20.0000 mg | ORAL_TABLET | Freq: Every day | ORAL | 3 refills | Status: AC

## 2023-10-05 NOTE — Progress Notes (Addendum)
 Established Patient Office Visit  Subjective   Patient ID: Brett Morgan, male    DOB: 21-Jul-1970  Age: 53 y.o. MRN: [id number]  Chief Complaint  Patient presents with   Annual Exam    HPI   Brett Morgan is seen for physical exam.  He has hyperlipidemia treated with rosuvastatin  20 mg daily.  History of colon polyps and just had repeat colonoscopy with recommended 1 year follow-up.  He has history of mild intermittent asthma.  Still smokes occasionally but infrequently.  He has gained some weight during the past year but is actually lost some since January.  Has done some intermittent fasting but predominantly low carbohydrate diet.  History of coronary calcium  score 44 few years ago.  His father had CAD age 55.  Health maintenance reviewed:  Health Maintenance  Topic Date Due   HIV Screening  Never done   Pneumococcal Vaccine 64-26 Years old (1 of 2 - PCV) Never done   Zoster Vaccines- Shingrix (1 of 2) Never done   COVID-19 Vaccine (4 - 2024-25 season) 02/01/2023   Lung Cancer Screening  08/26/2023   INFLUENZA VACCINE  01/01/2024   Colonoscopy  09/21/2024   DTaP/Tdap/Td (3 - Td or Tdap) 11/06/2025   Hepatitis C Screening  Completed   HPV VACCINES  Aged Out   Meningococcal B Vaccine  Aged Out   - He states he has had previous pneumonia vaccine but cannot find date or specific type of vaccine.  family history-father had coronary disease age 13.  Mother alive and well.  No history of diabetes.  Social history single.  No children.  Does lots of volunteer work with [employer].  Planned trip to [place]  this summer to lead 11-day hiking trip.  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  Still smokes occasionally.  The 10-year ASCVD risk score (Arnett DK, et al., 2019) is: 4.8%   Values used to calculate the score:     Age: 64 years     Sex: Male     Is Non-Hispanic African American: No     Diabetic: No     Tobacco smoker: No     Systolic Blood Pressure: 158 mmHg     Is  BP treated: No     HDL Cholesterol: 33.9 mg/dL     Total Cholesterol: 134 mg/dL   Review of Systems  Constitutional:  Negative for chills, fever, malaise/fatigue and weight loss.  HENT:  Negative for hearing loss.   Eyes:  Negative for blurred vision and double vision.  Respiratory:  Negative for cough and shortness of breath.   Cardiovascular:  Negative for chest pain, palpitations and leg swelling.  Gastrointestinal:  Negative for abdominal pain, blood in stool, constipation and diarrhea.  Genitourinary:  Negative for dysuria.  Skin:  Negative for rash.  Neurological:  Negative for dizziness, speech change, seizures, loss of consciousness and headaches.  Psychiatric/Behavioral:  Negative for depression.       Objective:     BP (!) 166/90 (BP Location: Left Arm, Patient Position: Sitting, Cuff Size: Large)   Pulse 85   Temp 97.8 F (36.6 C) (Oral)   Ht 5' 10.47" (1.79 m)   Wt 253 lb 12.8 oz (115.1 kg)   SpO2 97%   BMI 35.93 kg/m    Physical Exam Vitals reviewed.  Constitutional:      General: He is not in acute distress.    Appearance: He is well-developed.  HENT:     Head: Normocephalic  and atraumatic.     Right Ear: External ear normal.     Left Ear: External ear normal.  Eyes:     Conjunctiva/sclera: Conjunctivae normal.     Pupils: Pupils are equal, round, and reactive to light.  Neck:     Thyroid : No thyromegaly.  Cardiovascular:     Rate and Rhythm: Normal rate and regular rhythm.     Heart sounds: Normal heart sounds. No murmur heard. Pulmonary:     Effort: No respiratory distress.     Breath sounds: No wheezing or rales.  Abdominal:     General: Bowel sounds are normal. There is no distension.     Palpations: Abdomen is soft. There is no mass.     Tenderness: There is no abdominal tenderness. There is no guarding or rebound.  Musculoskeletal:     Cervical back: Normal range of motion and neck supple.  Lymphadenopathy:     Cervical: No cervical  adenopathy.  Skin:    Findings: No rash.  Neurological:     Mental Status: He is alert and oriented to person, place, and time.     Cranial Nerves: No cranial nerve deficit.      No results found for any visits on 10/05/23.  Last CBC Lab Results  Component Value Date   WBC 5.5 08/01/2022   HGB 14.4 08/01/2022   HCT 42.0 08/01/2022   MCV 91.8 08/01/2022   MCH 31.9 02/16/2013   RDW 12.8 08/01/2022   PLT 193.0 08/01/2022   Last metabolic panel Lab Results  Component Value Date   GLUCOSE 102 (H) 08/01/2022   NA 140 08/01/2022   K 4.3 08/01/2022   CL 104 08/01/2022   CO2 27 08/01/2022   BUN 19 08/01/2022   CREATININE 0.90 08/01/2022   GFR 99.56 08/01/2022   CALCIUM  9.3 08/01/2022   PROT 7.4 10/07/2022   ALBUMIN 4.4 10/07/2022   BILITOT 0.4 10/07/2022   ALKPHOS 77 10/07/2022   AST 21 10/07/2022   ALT 25 10/07/2022   Last lipids Lab Results  Component Value Date   CHOL 134 10/07/2022   HDL 33.90 (L) 10/07/2022   LDLCALC 73 10/07/2022   LDLDIRECT 168.5 05/30/2010   TRIG 137.0 10/07/2022   CHOLHDL 4 10/07/2022   Last thyroid  functions Lab Results  Component Value Date   TSH 1.26 10/17/2020      The 10-year ASCVD risk score (Arnett DK, et al., 2019) is: 5.2%    Assessment & Plan:   Problem List Items Addressed This Visit   None Visit Diagnoses       Physical exam    -  Primary   Relevant Orders   Basic metabolic panel with GFR   Lipid panel   CBC with Differential/Platelet   Hepatic function panel   PSA     Elevated blood pressure today without prior diagnosis of hypertension.  He had recent mildly elevated reading when getting colonoscopy.  Came down slightly to 158/84 after rest.  We discussed nonpharmacologic management with sodium reduction and weight loss.  Handout on DASH diet given.  He states he has fairly strong family history of hypertension in several relatives.  We elected to go ahead and initiate Mavik 40 mg once daily and set up 1 month  follow-up.  Also encouraged to obtain home blood pressure monitor and bring back readings at follow-up.  Continue weight loss efforts.  -Continue annual low-dose CT lung cancer screening.  He had scan in April but has not heard  back results yet.  Still waiting to be over read.  -Continue close follow-up with her GI regarding colonoscopy  -Recommend yearly flu vaccine  -Recommend minimum 150 minutes/week of moderate intensity exercise  Return in about 1 month (around 11/05/2023).     Correction to above- pt prescribed Micardis  (not Mavik)  Glean Lamy, MD

## 2023-10-07 ENCOUNTER — Encounter: Payer: Self-pay | Admitting: Family Medicine

## 2023-10-12 ENCOUNTER — Other Ambulatory Visit: Payer: Self-pay

## 2023-10-12 DIAGNOSIS — Z87891 Personal history of nicotine dependence: Secondary | ICD-10-CM

## 2023-10-12 DIAGNOSIS — Z122 Encounter for screening for malignant neoplasm of respiratory organs: Secondary | ICD-10-CM

## 2023-11-09 ENCOUNTER — Ambulatory Visit: Admitting: Family Medicine

## 2023-11-09 ENCOUNTER — Encounter: Payer: Self-pay | Admitting: Family Medicine

## 2023-11-09 VITALS — BP 134/70 | HR 84 | Temp 97.8°F | Wt 259.4 lb

## 2023-11-09 DIAGNOSIS — E785 Hyperlipidemia, unspecified: Secondary | ICD-10-CM

## 2023-11-09 DIAGNOSIS — I10 Essential (primary) hypertension: Secondary | ICD-10-CM | POA: Insufficient documentation

## 2023-11-09 MED ORDER — LISINOPRIL 10 MG PO TABS: 10.0000 mg | ORAL_TABLET | Freq: Every day | ORAL | 3 refills | Status: AC

## 2023-11-09 NOTE — Patient Instructions (Signed)
 Let's plan on 3 month follow up and plan to get fasting lipids then

## 2023-11-09 NOTE — Progress Notes (Signed)
 Established Patient Office Visit  Subjective   Patient ID: Brett Morgan, male    DOB: [birth year]  Age: 53 y.o. MRN: [id number]  Chief Complaint  Patient presents with   Medical Management of Chronic Issues    HPI   Brett Morgan is seen for follow-up hypertension.  Refer to last note for details.  He was seen for physical in May.  Blood pressure was up and we prescribed telmisartan  40 mg daily.  His blood pressures have been improved by home readings but unfortunately had some diarrhea fairly consistently since starting this.  He is fairly convinced this is related to the medication.  No bloody stools.  No nausea or vomiting.  He does have automated home cuff and most of his readings have been around 130s systolic and 70s diastolic.  He had recent low-dose CT lung cancer screening.  This did show some calcification LAD and RCA.  He had coronary calcium  score back in 2022 which came back total 44 with mostly calcium  and RCA of 39 and LAD 5.  He states he had 1 through work last year which showed total score around 88.  He is on rosuvastatin  20 mg daily.  Recent LDL cholesterol 100.  He states he had poor compliance with diet.  Is reluctant to add Zetia or increase rosuvastatin  at this time.  Would like to give this 3 months of lifestyle modification first.  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   Personal history of urinary calculi 04/18/2009   Qualifier: Diagnosis of  By: Neckers LPN, Brett Morgan     Polyposis coli 09/10/2022   20 adenomas max 15 mm - recall 1 year - recommending genetics eval   Past Surgical History:  Procedure Laterality Date   APPENDECTOMY     COLONOSCOPY W/ POLYPECTOMY  09/10/2022   KNEE SURGERY     arthroscopic left    LAPAROSCOPIC APPENDECTOMY N/A 02/16/2013   Procedure: APPENDECTOMY LAPAROSCOPIC;  Surgeon: Brett Nixon, MD;  Location: [facility];  Service: General;  Laterality: N/A;   LUMBAR LAMINECTOMY/DECOMPRESSION MICRODISCECTOMY  10/16/2011   Procedure: LUMBAR LAMINECTOMY/DECOMPRESSION MICRODISCECTOMY;  Surgeon: Brett Ring, MD;  Location: [facility];  Service: Orthopedics;  Laterality: Right;  Micro Lumbar Decompression L4-L5 on Right   TONSILLECTOMY     TONSILLECTOMY      reports that he quit smoking about 2 years ago. His smoking use included cigarettes and cigars. He started smoking about 34 years ago. He has a 32 pack-year smoking history. He has quit using smokeless tobacco. He reports current alcohol use. He reports that he does not use drugs. family history includes Arthritis in his mother; Cancer in an other family member; Colon polyps in his father and maternal grandfather; Heart disease (age of onset: 16) in his father; Hypertension in an other family member; Stroke in an other family member. No Known Allergies  Review of Systems  Constitutional:  Negative for malaise/fatigue.  Eyes:  Negative for blurred vision.  Respiratory:  Negative for shortness of breath.   Cardiovascular:  Negative for chest pain.  Gastrointestinal:  Positive for diarrhea.       See HPI  Neurological:  Negative for dizziness, weakness and headaches.      Objective:     BP 134/70 (BP Location: Left Arm, Cuff Size: Large)   Pulse 84   Temp 97.8 F (36.6 C) (Oral)   Wt 259 lb 6.4 oz (117.7 kg)   SpO2 97%   BMI 36.72 kg/m  BP Readings from Last 3 Encounters:  11/09/23 134/70  10/05/23 (!) 158/84  09/22/23 (!) 161/96   Wt Readings from Last 3 Encounters:  11/09/23 259 lb 6.4 oz (117.7 kg)  10/05/23 253 lb 12.8 oz (115.1 kg)  09/22/23 247 lb (112 kg)      Physical Exam Vitals reviewed.  Constitutional:      General: He is not in acute distress.    Appearance: He is not ill-appearing.  Cardiovascular:      Rate and Rhythm: Normal rate and regular rhythm.  Pulmonary:     Effort: Pulmonary effort is normal.     Breath sounds: Normal breath sounds. No wheezing or rales.  Musculoskeletal:     Right lower leg: No edema.     Left lower leg: No edema.  Neurological:     Mental Status: He is alert.      No results found for any visits on 11/09/23.  Last CBC Lab Results  Component Value Date   WBC 7.6 10/05/2023   HGB 13.9 10/05/2023   HCT 41.7 10/05/2023   MCV 93.3 10/05/2023   MCH 31.9 02/16/2013   RDW 13.8 10/05/2023   PLT 210.0 10/05/2023   Last metabolic panel Lab Results  Component Value Date   GLUCOSE 100 (H) 10/05/2023   NA 140 10/05/2023   K 4.8 10/05/2023   CL 103 10/05/2023   CO2 28 10/05/2023   BUN 16 10/05/2023   CREATININE 0.88 10/05/2023   GFR 99.42 10/05/2023   CALCIUM  9.7 10/05/2023   PROT 7.2 10/05/2023   ALBUMIN 4.5 10/05/2023   BILITOT 0.5 10/05/2023   ALKPHOS 85 10/05/2023   AST 19 10/05/2023   ALT 18 10/05/2023   Last lipids Lab Results  Component Value Date   CHOL 152 10/05/2023   HDL 31.50 (L) 10/05/2023   LDLCALC 100 (H) 10/05/2023   LDLDIRECT 168.5 05/30/2010   TRIG 102.0 10/05/2023   CHOLHDL 5 10/05/2023   Last hemoglobin A1c No results found for: "HGBA1C"    The 10-year ASCVD risk score (Arnett DK, et al., 2019) is: 4.6%    Assessment & Plan:   #1 hypertension improved on telmisartan .  Unfortunately, he has had some diarrhea which may be related.  Even though this is an uncommon side effect with telmisartan  he has had fairly consistent symptoms since starting this medication.  Will stop this and start lisinopril 10 mg daily.  Reviewed potential side effects.  Set up follow-up in 3 months to reassess.  He is also encouraged try to lose some weight and tighten up diet  #2 hyperlipidemia.  Recent LDL cholesterol 100.  On rosuvastatin  20 mg daily.  History of elevated coronary calcium  score.  We recommend trying to be more aggressive  with lowering his LDL below 70.  He prefers 3 months of diet and exercise and rechecking fasting lipids before adding additional medication such as Zetia  Glean Lamy, MD

## 2023-11-17 ENCOUNTER — Inpatient Hospital Stay: Attending: Genetic Counselor | Admitting: Genetic Counselor

## 2023-11-17 ENCOUNTER — Other Ambulatory Visit: Payer: Self-pay | Admitting: Genetic Counselor

## 2023-11-17 ENCOUNTER — Encounter: Payer: Self-pay | Admitting: Genetic Counselor

## 2023-11-17 ENCOUNTER — Inpatient Hospital Stay

## 2023-11-17 DIAGNOSIS — Z860109 Personal history of other colon polyps: Secondary | ICD-10-CM

## 2023-11-17 DIAGNOSIS — Z1379 Encounter for other screening for genetic and chromosomal anomalies: Secondary | ICD-10-CM

## 2023-11-17 LAB — GENETIC SCREENING ORDER

## 2023-11-17 NOTE — Progress Notes (Signed)
 REFERRING PROVIDER: Marquetta Sit, MD [address] [place],  [place] [address]  PRIMARY PROVIDER:  Marquetta Sit, MD  PRIMARY REASON FOR VISIT:  1. Personal history of other colon polyps    HISTORY OF PRESENT ILLNESS:   Brett Morgan, a 53 y.o. male, was seen for a  cancer genetics consultation at the request of Dr. Darren Em due to a personal history of colon polyps.  Brett Morgan presents to clinic today to discuss the possibility of a hereditary predisposition to cancer, to discuss genetic testing, and to further clarify his future cancer risks, as well as potential cancer risks for family members.   Brett Morgan had a colonoscopy in 2024 that identified 20 tubular adenomas and a more recent colonoscopy in 2025 that identified 2 tubular adenomas.  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   20 adenomas max 15 mm - recall 1 year - recommending genetics eval    Past Surgical History:  Procedure Laterality Date   APPENDECTOMY     COLONOSCOPY W/ POLYPECTOMY  09/10/2022   KNEE SURGERY     arthroscopic left   LAPAROSCOPIC APPENDECTOMY N/A 02/16/2013   Procedure: APPENDECTOMY LAPAROSCOPIC;  Surgeon: Joyce Nixon, MD;  Location: [facility];  Service: General;  Laterality: N/A;   LUMBAR LAMINECTOMY/DECOMPRESSION MICRODISCECTOMY  10/16/2011   Procedure: LUMBAR LAMINECTOMY/DECOMPRESSION MICRODISCECTOMY;  Surgeon: Loel Ring, MD;  Location: [facility];  Service: Orthopedics;  Laterality: Right;  Micro Lumbar Decompression L4-L5 on Right   TONSILLECTOMY     TONSILLECTOMY      Social History    Socioeconomic History   Marital status: Single    Spouse name: Not on file   Number of children: Not on file   Years of education: Not on file   Highest education level: Not on file  Occupational History   Not on file  Tobacco Use   Smoking status: Former    Current packs/day: 0.00    Average packs/day: 1 pack/day for 32.0 years (32.0 ttl pk-yrs)    Types: Cigarettes, Cigars    Start date: 25    Quit date: 2023    Years since quitting: 2.4   Smokeless tobacco: Former   Tobacco comments:    Occ cigar with friends stopped smoking cigarettes at the end 2023 uses a nicotine  patch   Vaping Use   Vaping status: Never Used  Substance and Sexual Activity   Alcohol use: Yes    Comment: rarely   Drug use: No   Sexual activity: Not on file  Other Topics Concern   Not on file  Social History Narrative   Not on file   Social Drivers of Health   Financial Resource Strain: Not on file  Food Insecurity: Not on file  Transportation Needs: Not on file  Physical Activity: Not on file  Stress: Not on file  Social Connections: Not on file     FAMILY HISTORY:  We obtained a detailed, 4-generation family history.  Significant diagnoses are listed below: Family  History  Problem Relation Age of Onset   Arthritis Mother    Colon polyps Father    Heart disease Father 84       CAD   Cancer Maternal Aunt        unknown type   Lung cancer Maternal Grandmother 75       smoked   Colon polyps Maternal Grandfather    Lung cancer Paternal Grandfather 12   Lymphoma Paternal Grandfather 70   Liver cancer Paternal Grandfather 79   Hypertension Other    Cancer Other        grandparent, lung   Stroke Other        grandparent   Colon cancer Neg Hx    Esophageal cancer Neg Hx    Rectal cancer Neg Hx    Stomach cancer Neg Hx      Brett Morgan is unaware of previous family history of genetic testing for hereditary cancer risks. There is no reported Ashkenazi Jewish ancestry.   GENETIC  COUNSELING ASSESSMENT: Brett Morgan is a 53 y.o. male with a personal history of colon polyps which is somewhat suggestive of a hereditary predisposition to cancer given >10 tubular adenomas. We, therefore, discussed and recommended the following at today's visit.   DISCUSSION: We discussed that 5 - 10% of cancer is hereditary, with most cases of polyposis associated with APC and MUTYH.  There are other genes that can be associated with hereditary polyposis/cancer syndromes.  We discussed that testing is beneficial for several reasons including knowing how to follow individuals and understanding if other family members could be at risk for cancer and allowing them to undergo genetic testing.   We reviewed the characteristics, features and inheritance patterns of hereditary polyposis/cancer syndromes. We also discussed genetic testing, including the appropriate family members to test, the process of testing, insurance coverage and turn-around-time for results. We discussed the implications of a negative, positive, carrier and/or variant of uncertain significant result. We recommended Brett Morgan pursue genetic testing for a panel that includes genes associated with polyposis/cancer.   Brett Morgan  was offered a common hereditary cancer panel (40 genes) and an expanded pan-cancer panel (77 genes). Brett Morgan was informed of the benefits and limitations of each panel, including that expanded pan-cancer panels contain genes that do not have clear management guidelines at this point in time.  We also discussed that as the number of genes included on a panel increases, the chances of variants of uncertain significance increases. After considering the benefits and limitations of each gene panel, Brett Morgan elected to have [facility] Panel.  The [facility] CancerNext+RNAinsight Panel includes sequencing, rearrangement analysis, and RNA analysis for the following 40 genes: APC, ATM, BAP1, BARD1, BMPR1A, BRCA1, BRCA2, BRIP1,  CDH1, CDKN2A, CHEK2, FH, FLCN, MET, MLH1, MSH2, MSH6, MUTYH, NF1, NTHL1, PALB2, PMS2, PTEN, RAD51C, RAD51D, RPS20, SMAD4, STK11, TP53, TSC1, TSC2, and VHL (sequencing and deletion/duplication); AXIN2, HOXB13, MBD4, MSH3, POLD1 and POLE (sequencing only); EPCAM and GREM1 (deletion/duplication only).  Based on Brett Morgan personal and family history of cancer, he meets medical criteria for genetic testing. Despite that he meets criteria, he may still have an out of pocket cost. We discussed that if his out of pocket cost for testing is over $100, the laboratory should contact them to discuss self-pay prices, patient pay assistance programs, if applicable, and other billing options.  PLAN: After considering the risks, benefits, and limitations, Brett Morgan provided informed consent to pursue genetic testing and the blood sample  was sent to [facility] for analysis of the CancerNext Panel. Results should be available within approximately 2-3 weeks' time, at which point they will be disclosed by telephone to Brett Morgan, as will any additional recommendations warranted by these results. Brett Morgan will receive a summary of his genetic counseling visit and a copy of his results once available. This information will also be available in Epic.   Brett Morgan questions were answered to his satisfaction today. Our contact information was provided should additional questions or concerns arise. Thank you for the referral and allowing us  to share in the care of your patient.   Brett Crotty, MS, [facility] Genetic Counselor [email or web address] (P) [phone]  60 minutes were spent on the date of the encounter in service to the patient including preparation, face-to-face consultation, documentation and care coordination. The patient was seen alone.  Drs. Gudena and/or Maryalice Smaller were available to discuss this case as needed.   _______________________________________________________________________ For Office  Staff:  Number of people involved in session: 1 Was an Intern/ student involved with case: yes

## 2023-11-26 ENCOUNTER — Other Ambulatory Visit: Payer: Self-pay | Admitting: Family Medicine

## 2023-11-30 ENCOUNTER — Telehealth: Payer: Self-pay | Admitting: Genetic Counselor

## 2023-11-30 ENCOUNTER — Encounter: Payer: Self-pay | Admitting: Genetic Counselor

## 2023-11-30 DIAGNOSIS — Z1379 Encounter for other screening for genetic and chromosomal anomalies: Secondary | ICD-10-CM | POA: Insufficient documentation

## 2023-11-30 NOTE — Telephone Encounter (Addendum)
 I contacted Mr. Suski to discuss his genetic testing results. No pathogenic variants were identified in the 40 genes analyzed. Detailed clinic note to follow.  The test report has been scanned into EPIC and is located under the Molecular Pathology section of the Results Review tab.  A portion of the result report is included below for reference.   Kaniyah Lisby, MS, [facility] Genetic Counselor [email or web address] (P) [phone]

## 2023-12-11 ENCOUNTER — Ambulatory Visit: Payer: Self-pay | Admitting: Genetic Counselor

## 2023-12-11 NOTE — Progress Notes (Signed)
 HPI:   Brett Morgan was previously seen in the [facility] due to a personal history of colon polyps and concerns regarding a hereditary predisposition to cancer. Please refer to our prior cancer genetics clinic note for more information regarding our discussion, assessment and recommendations, at the time. Brett Morgan recent genetic test results were disclosed to him, as were recommendations warranted by these results. These results and recommendations are discussed in more detail below.  CANCER HISTORY:  Oncology History   No history exists.    FAMILY HISTORY:  We obtained a detailed, 4-generation family history.  Significant diagnoses are listed below:      Family History  Problem Relation Age of Onset   Arthritis Mother     Colon polyps Father     Heart disease Father 43        CAD   Cancer Maternal Aunt          unknown type   Lung cancer Maternal Grandmother 75        smoked   Colon polyps Maternal Grandfather     Lung cancer Paternal Grandfather 20   Lymphoma Paternal Grandfather 53   Liver cancer Paternal Grandfather 33   Hypertension Other     Cancer Other          grandparent, lung   Stroke Other          grandparent   Colon cancer Neg Hx     Esophageal cancer Neg Hx     Rectal cancer Neg Hx     Stomach cancer Neg Hx             Brett Morgan is unaware of previous family history of genetic testing for hereditary cancer risks. There is no reported Ashkenazi Jewish ancestry.   GENETIC TEST RESULTS:  The [facility] Panel found no pathogenic mutations.   The [facility] CancerNext+RNAinsight Panel includes sequencing, rearrangement analysis, and RNA analysis for the following 40 genes: APC, ATM, BAP1, BARD1, BMPR1A, BRCA1, BRCA2, BRIP1, CDH1, CDKN2A, CHEK2, FH, FLCN, MET, MLH1, MSH2, MSH6, MUTYH, NF1, NTHL1, PALB2, PMS2, PTEN, RAD51C, RAD51D, RPS20, SMAD4, STK11, TP53, TSC1, TSC2, and VHL (sequencing and deletion/duplication); AXIN2, HOXB13, MBD4,  MSH3, POLD1 and POLE (sequencing only); EPCAM and GREM1 (deletion/duplication only).  The test report has been scanned into EPIC and is located under the Molecular Pathology section of the Results Review tab.  A portion of the result report is included below for reference. Genetic testing reported out on 11/28/2023.      Even though a pathogenic variant was not identified, possible explanations for the polyps/cancer in the family may include: There may be no hereditary risk for polyps/cancer in the family. His personal history of polyps may be due to other genetic or environmental factors. There may be a gene mutation in one of these genes that current testing methods cannot detect, but that chance is small. There could be another gene that has not yet been discovered, or that we have not yet tested, that is responsible for the polyps/cancer diagnoses in the family.   Therefore, it is important to remain in touch with cancer genetics in the future so that we can continue to offer Brett Morgan the most up to date genetic testing.   ADDITIONAL GENETIC TESTING:  We discussed with Brett Morgan that his genetic testing was fairly extensive.  If there are genes identified to increase polyposis/cancer risk that can be analyzed in the future, we would be happy to  discuss and coordinate this testing at that time.    CANCER SCREENING RECOMMENDATIONS:  Brett Morgan test result is considered negative (normal).  This means that we have not identified a hereditary cause for his personal history of colon polyps at this time.   An individual's cancer risk and medical management are not determined by genetic test results alone. Overall cancer risk assessment incorporates additional factors, including personal medical history, family history, and any available genetic information that may result in a personalized plan for cancer prevention and surveillance. Therefore, it is recommended he continue to follow the cancer  management and screening guidelines provided by his healthcare providers.  FOLLOW-UP:  Cancer genetics is a rapidly advancing field and it is possible that new genetic tests will be appropriate for him and/or his family members in the future. We encouraged him to remain in contact with cancer genetics on an annual basis so we can update his personal and family histories and let him know of advances in cancer genetics that may benefit this family.   Our contact number was provided. Brett Morgan questions were answered to his satisfaction, and he knows he is welcome to call us  at anytime with additional questions or concerns.   Tj Kitchings, MS, [facility] Genetic Counselor [email or web address] (P) [phone]

## 2024-02-12 ENCOUNTER — Ambulatory Visit: Admitting: Family Medicine

## 2024-02-12 ENCOUNTER — Ambulatory Visit: Payer: Self-pay | Admitting: Family Medicine

## 2024-02-12 ENCOUNTER — Telehealth: Payer: Self-pay | Admitting: *Deleted

## 2024-02-12 ENCOUNTER — Encounter: Payer: Self-pay | Admitting: Family Medicine

## 2024-02-12 VITALS — BP 118/62 | HR 74 | Temp 97.6°F | Wt 254.0 lb

## 2024-02-12 DIAGNOSIS — E785 Hyperlipidemia, unspecified: Secondary | ICD-10-CM | POA: Diagnosis not present

## 2024-02-12 DIAGNOSIS — I10 Essential (primary) hypertension: Secondary | ICD-10-CM | POA: Diagnosis not present

## 2024-02-12 LAB — LIPID PANEL
Cholesterol: 146 mg/dL (ref 0–200)
HDL: 40.9 mg/dL (ref >39.00)
LDL Cholesterol: 81 mg/dL (ref 0–99)
NonHDL: 104.78
Total CHOL/HDL Ratio: 4
Triglycerides: 117 mg/dL (ref 0.0–149.0)
VLDL: 23.4 mg/dL (ref 0.0–40.0)

## 2024-02-12 NOTE — Telephone Encounter (Signed)
 Please see result note

## 2024-02-12 NOTE — Progress Notes (Signed)
 Noted.  Kristian Covey MD [facility] at [facility]

## 2024-02-12 NOTE — Progress Notes (Signed)
 Established Patient Office Visit  Subjective   Patient ID: Brett Morgan, male    DOB: [birth year]  Age: 53 y.o. MRN: [id number]  Chief Complaint  Patient presents with   Medical Management of Chronic Issues    HPI   Brett Morgan is here for follow up regarding hypertension and hyperlipidemia.  He had been placed on telmisartan  recently and had some diarrhea.  Diarrhea symptoms resolved after coming off that.  We placed him on lisinopril  10 mg daily tolerating well.  No cough.  He did have acute respiratory illness a few weeks ago and wonders if he may have had COVID.  He has hyperlipidemia treated with rosuvastatin  20 mg daily.  Recent LDL cholesterol 100.  We discussed possible addition of Zetia or further titration of statin but he preferred few months of lifestyle modification.  Has lost about 5 pounds.  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   20 adenomas max 15 mm - recall 1 year - recommending genetics eval   Past Surgical History:  Procedure Laterality Date   APPENDECTOMY     COLONOSCOPY W/ POLYPECTOMY  09/10/2022   KNEE SURGERY     arthroscopic left   LAPAROSCOPIC APPENDECTOMY N/A 02/16/2013   Procedure: APPENDECTOMY LAPAROSCOPIC;  Surgeon: Bernarda Debby, MD;  Location: [facility];  Service: General;  Laterality: N/A;   LUMBAR LAMINECTOMY/DECOMPRESSION MICRODISCECTOMY  10/16/2011   Procedure: LUMBAR LAMINECTOMY/DECOMPRESSION MICRODISCECTOMY;  Surgeon: Reyes JAYSON Billing, MD;  Location: [facility];  Service:  Orthopedics;  Laterality: Right;  Micro Lumbar Decompression L4-L5 on Right   TONSILLECTOMY     TONSILLECTOMY      reports that he quit smoking about 2 years ago. His smoking use included cigarettes and cigars. He started smoking about 34 years ago. He has a 32 pack-year smoking history. He has quit using smokeless tobacco. He reports current alcohol use. He reports that he does not use drugs. family history includes Arthritis in his mother; Cancer in his maternal aunt and another family member; Colon polyps in his father and maternal grandfather; Heart disease (age of onset: 74) in his father; Hypertension in an other family member; Liver cancer (age of onset: 41) in his paternal grandfather; Lung cancer (age of onset: 51) in his paternal grandfather; Lung cancer (age of onset: 70) in his maternal grandmother; Lymphoma (age of onset: 51) in his paternal grandfather; Stroke in an other family member. No Known Allergies  Review of Systems  Constitutional:  Negative for malaise/fatigue.  Eyes:  Negative for blurred vision.  Respiratory:  Negative for shortness of breath.   Cardiovascular:  Negative for chest pain.  Neurological:  Negative for dizziness, weakness and headaches.      Objective:     BP 118/62 (BP Location: Left Arm, Cuff Size: Large)   Pulse 74   Temp 97.6 F (36.4 C) (Oral)   Wt 254 lb (115.2 kg)   SpO2  95%   BMI 35.96 kg/m  BP Readings from Last 3 Encounters:  02/12/24 118/62  11/09/23 134/70  10/05/23 (!) 158/84   Wt Readings from Last 3 Encounters:  02/12/24 254 lb (115.2 kg)  11/09/23 259 lb 6.4 oz (117.7 kg)  10/05/23 253 lb 12.8 oz (115.1 kg)      Physical Exam Vitals reviewed.  Constitutional:      Appearance: He is well-developed.  HENT:     Right Ear: External ear normal.     Left Ear: External ear normal.  Eyes:     Pupils: Pupils are equal, round, and reactive to light.  Neck:     Thyroid : No thyromegaly.  Cardiovascular:     Rate and  Rhythm: Normal rate and regular rhythm.  Pulmonary:     Effort: Pulmonary effort is normal. No respiratory distress.     Breath sounds: Normal breath sounds. No wheezing or rales.  Musculoskeletal:     Cervical back: Neck supple.  Neurological:     Mental Status: He is alert and oriented to person, place, and time.      No results found for any visits on 02/12/24.  Last lipids Lab Results  Component Value Date   CHOL 152 10/05/2023   HDL 31.50 (L) 10/05/2023   LDLCALC 100 (H) 10/05/2023   LDLDIRECT 168.5 05/30/2010   TRIG 102.0 10/05/2023   CHOLHDL 5 10/05/2023      The 89-bzjm ASCVD risk score (Arnett DK, et al., 2019) is: 4.7%    Assessment & Plan:   #1 hypertension.  Well-controlled by reading today after rest.  We obtained reading 118/62 with large cuff left arm seated after 10 minutes of rest.  Continue weight control efforts.  Continue lisinopril  10 mg daily  #2 hyperlipidemia treated with rosuvastatin  20 mg daily.  Recheck fasting lipid.  If LDL not closer to goal consider addition of Zetia versus further titration of rosuvastatin .  Continue heart healthy diet  No follow-ups on file.    Wolm Scarlet, MD

## 2024-02-12 NOTE — Telephone Encounter (Signed)
 Copied from CRM #[id number]. Topic: Clinical - Lab/Test Results >> Feb 12, 2024  3:04 PM [place] J wrote: Reason for CRM: Patient is returning a call from the medical assistant. Patient has reviewed his lab results via MyChart, and we assumed that is why a call was made to the patient.
